# Patient Record
Sex: Male | Born: 1975 | Race: Black or African American | Hispanic: No | Marital: Married | Smoking: Never smoker
Health system: Southern US, Community
[De-identification: ages and names within clinical notes are randomized; demographics above are authoritative.]

## PROBLEM LIST (undated history)

## (undated) DIAGNOSIS — E785 Hyperlipidemia, unspecified: Secondary | ICD-10-CM

## (undated) DIAGNOSIS — E66811 Obesity, class 1: Secondary | ICD-10-CM

## (undated) DIAGNOSIS — E119 Type 2 diabetes mellitus without complications: Secondary | ICD-10-CM

## (undated) DIAGNOSIS — E669 Obesity, unspecified: Secondary | ICD-10-CM

## (undated) DIAGNOSIS — N183 Chronic kidney disease, stage 3 unspecified: Secondary | ICD-10-CM

## (undated) DIAGNOSIS — I119 Hypertensive heart disease without heart failure: Secondary | ICD-10-CM

## (undated) DIAGNOSIS — I10 Essential (primary) hypertension: Secondary | ICD-10-CM

## (undated) HISTORY — DX: Chronic kidney disease, stage 3 unspecified: N18.30

## (undated) HISTORY — DX: Hyperlipidemia, unspecified: E78.5

## (undated) HISTORY — DX: Essential (primary) hypertension: I10

## (undated) HISTORY — DX: Obesity, unspecified: E66.9

## (undated) HISTORY — DX: Type 2 diabetes mellitus without complications: E11.9

## (undated) HISTORY — DX: Obesity, class 1: E66.811

## (undated) HISTORY — DX: Hypertensive heart disease without heart failure: I11.9

## (undated) HISTORY — PX: NO PAST SURGERIES: SHX2092

## (undated) HISTORY — DX: Chronic kidney disease, stage 3 (moderate): N18.3

---

## 2006-10-29 ENCOUNTER — Encounter: Payer: Self-pay | Admitting: Cardiology

## 2006-10-29 ENCOUNTER — Ambulatory Visit: Payer: Self-pay | Admitting: Cardiology

## 2006-10-29 ENCOUNTER — Inpatient Hospital Stay (HOSPITAL_COMMUNITY): Admission: EM | Admit: 2006-10-29 | Discharge: 2006-10-31 | Payer: Self-pay | Admitting: Emergency Medicine

## 2006-11-03 ENCOUNTER — Emergency Department (HOSPITAL_COMMUNITY): Admission: EM | Admit: 2006-11-03 | Discharge: 2006-11-03 | Payer: Self-pay | Admitting: Family Medicine

## 2006-11-11 ENCOUNTER — Ambulatory Visit: Payer: Self-pay | Admitting: Cardiology

## 2006-11-15 ENCOUNTER — Ambulatory Visit: Payer: Self-pay | Admitting: Cardiology

## 2006-11-15 ENCOUNTER — Ambulatory Visit: Payer: Self-pay

## 2006-12-02 ENCOUNTER — Ambulatory Visit: Payer: Self-pay | Admitting: Cardiology

## 2006-12-17 ENCOUNTER — Ambulatory Visit: Payer: Self-pay | Admitting: Cardiology

## 2006-12-17 ENCOUNTER — Ambulatory Visit: Payer: Self-pay | Admitting: Internal Medicine

## 2007-01-24 ENCOUNTER — Ambulatory Visit: Payer: Self-pay | Admitting: Cardiology

## 2007-02-03 ENCOUNTER — Ambulatory Visit: Payer: Self-pay | Admitting: Internal Medicine

## 2007-02-27 ENCOUNTER — Ambulatory Visit: Payer: Self-pay | Admitting: Internal Medicine

## 2007-02-27 LAB — CONVERTED CEMR LAB
CO2: 29 meq/L (ref 19–32)
Chloride: 108 meq/L (ref 96–112)
GFR calc non Af Amer: 58 mL/min
HDL: 32.9 mg/dL — ABNORMAL LOW (ref >39.0)
VLDL: 19 mg/dL (ref 0–40)

## 2007-05-15 ENCOUNTER — Ambulatory Visit: Payer: Self-pay | Admitting: Internal Medicine

## 2007-05-15 LAB — CONVERTED CEMR LAB
CO2: 30 meq/L (ref 19–32)
Calcium: 9.6 mg/dL (ref 8.4–10.5)
Chloride: 106 meq/L (ref 96–112)
Creatinine, Ser: 1.7 mg/dL — ABNORMAL HIGH (ref 0.4–1.5)
GFR calc Af Amer: 60 mL/min
GFR calc non Af Amer: 50 mL/min
Potassium: 4.3 meq/L (ref 3.5–5.1)
Sodium: 141 meq/L (ref 135–145)

## 2007-06-13 IMAGING — CR DG CHEST 1V PORT
1 series · 1 of 1 positions shown · non-contrast
Comparison: none

HISTORY: Chest pain, hypertension, code STEMI

PORTABLE CHEST ONE VIEW:
Portable exam 9491 hours no priors for comparison.
Minimally kyphotic positioning.
Low lung volumes.
Slightly accentuated heart size.
Normal mediastinal contours and pulmonary vascularity.
Minimal basilar atelectasis bilaterally.
Lungs otherwise clear.

[view not recorded]
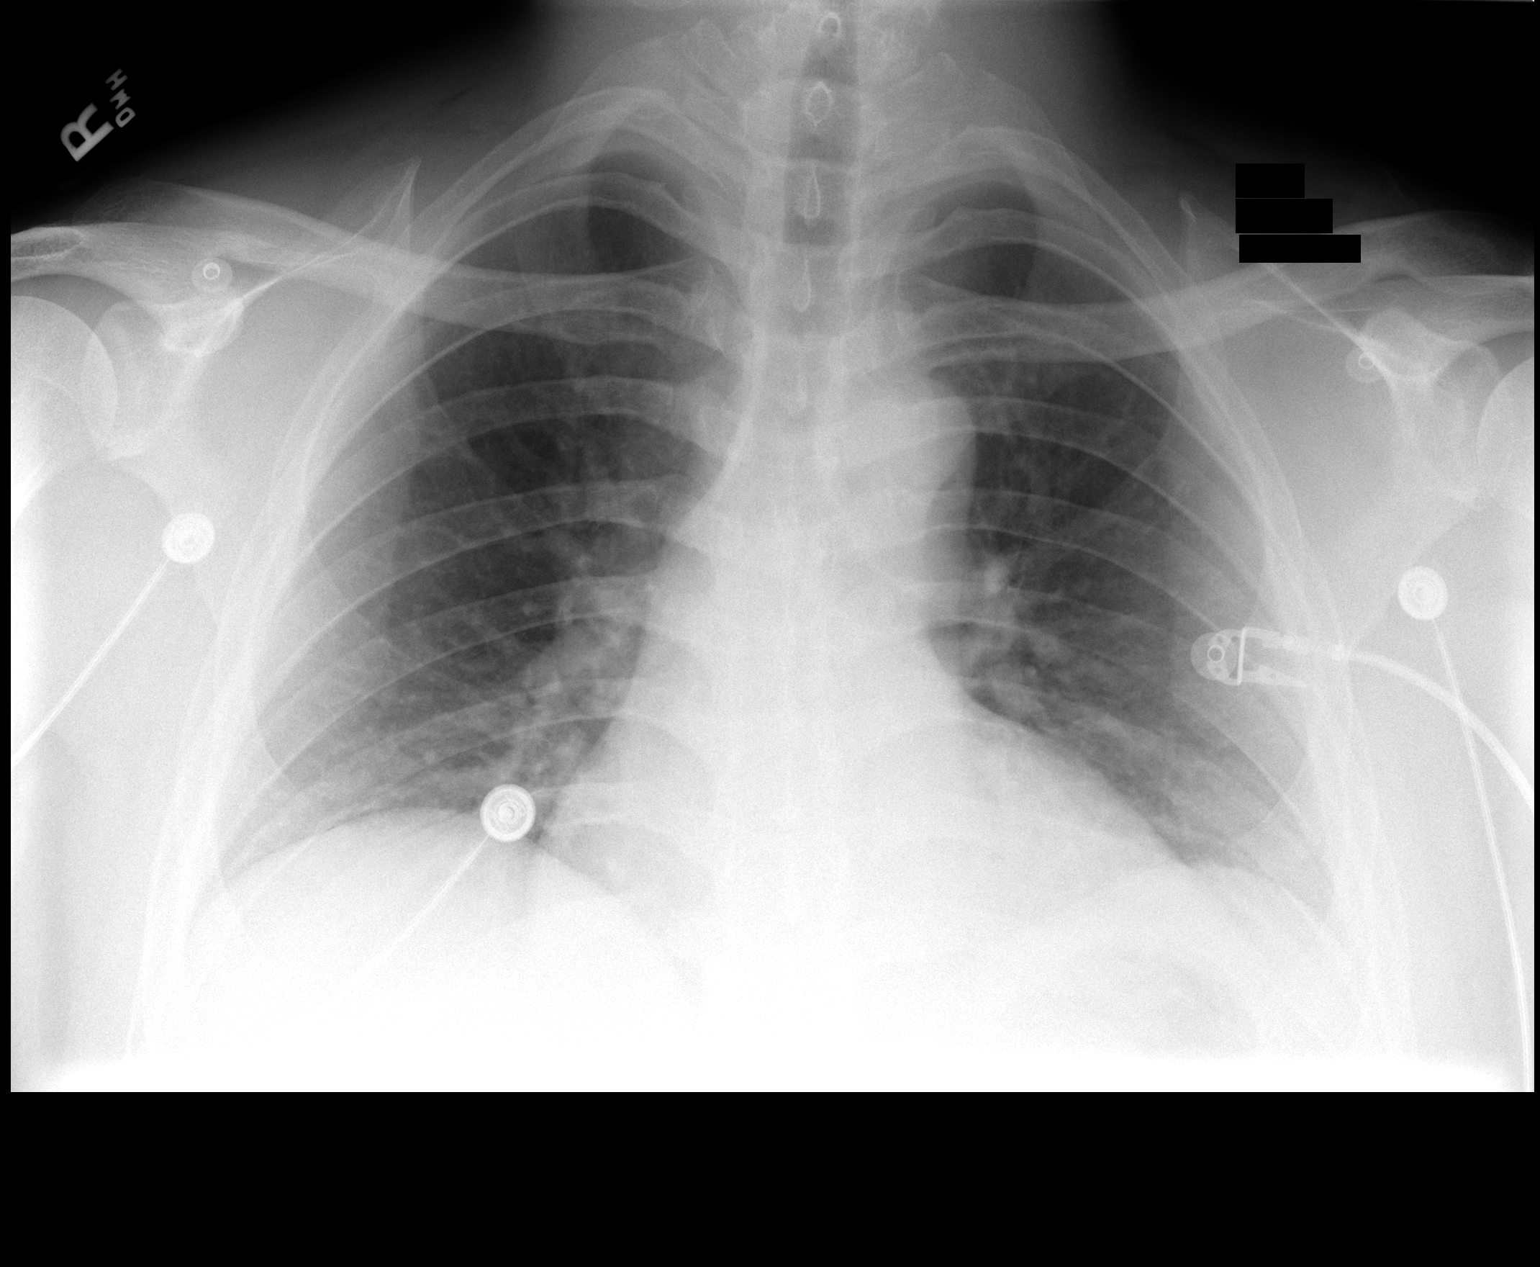

[1 of 1 positions shown; findings below may reference images not displayed]

IMPRESSION: Low lung volumes with minimal bibasilar atelectasis.

## 2007-07-25 ENCOUNTER — Ambulatory Visit: Payer: Self-pay | Admitting: Internal Medicine

## 2007-07-25 LAB — CONVERTED CEMR LAB: Creatinine, Ser: 1.6 mg/dL — ABNORMAL HIGH (ref 0.4–1.5)

## 2007-08-14 ENCOUNTER — Ambulatory Visit: Payer: Self-pay | Admitting: Internal Medicine

## 2007-10-29 DIAGNOSIS — E785 Hyperlipidemia, unspecified: Secondary | ICD-10-CM

## 2007-10-29 DIAGNOSIS — I10 Essential (primary) hypertension: Secondary | ICD-10-CM

## 2007-10-29 DIAGNOSIS — I119 Hypertensive heart disease without heart failure: Secondary | ICD-10-CM | POA: Insufficient documentation

## 2007-12-05 ENCOUNTER — Ambulatory Visit: Payer: Self-pay | Admitting: Internal Medicine

## 2007-12-24 ENCOUNTER — Telehealth: Payer: Self-pay | Admitting: Internal Medicine

## 2008-01-15 ENCOUNTER — Ambulatory Visit: Payer: Self-pay | Admitting: Internal Medicine

## 2008-01-15 DIAGNOSIS — J069 Acute upper respiratory infection, unspecified: Secondary | ICD-10-CM | POA: Insufficient documentation

## 2008-10-20 ENCOUNTER — Ambulatory Visit: Payer: Self-pay | Admitting: Internal Medicine

## 2008-10-20 LAB — CONVERTED CEMR LAB
CO2: 31 meq/L (ref 19–32)
Chloride: 109 meq/L (ref 96–112)
Cholesterol: 119 mg/dL (ref 0–200)
HDL: 31.4 mg/dL — ABNORMAL LOW (ref >39.0)
LDL Cholesterol: 74 mg/dL (ref 0–99)
Pro B Natriuretic peptide (BNP): 12 pg/mL (ref 0.0–100.0)
Sodium: 143 meq/L (ref 135–145)
Triglycerides: 67 mg/dL (ref 0–149)
VLDL: 13 mg/dL (ref 0–40)

## 2008-11-02 ENCOUNTER — Ambulatory Visit: Payer: Self-pay | Admitting: Cardiovascular Disease

## 2008-11-16 ENCOUNTER — Encounter: Payer: Self-pay | Admitting: Cardiovascular Disease

## 2008-11-16 ENCOUNTER — Ambulatory Visit: Payer: Self-pay

## 2009-03-14 ENCOUNTER — Telehealth: Payer: Self-pay | Admitting: Internal Medicine

## 2009-04-12 ENCOUNTER — Ambulatory Visit: Payer: Self-pay | Admitting: Internal Medicine

## 2009-10-17 ENCOUNTER — Ambulatory Visit: Payer: Self-pay | Admitting: Internal Medicine

## 2009-10-18 ENCOUNTER — Encounter: Payer: Self-pay | Admitting: Internal Medicine

## 2009-10-18 LAB — CONVERTED CEMR LAB
Albumin: 4.4 g/dL (ref 3.5–5.2)
CO2: 28 meq/L (ref 19–32)
Cholesterol: 137 mg/dL (ref 0–200)
Creatinine, Ser: 1.77 mg/dL — ABNORMAL HIGH (ref 0.40–1.50)
Glucose, Bld: 142 mg/dL — ABNORMAL HIGH (ref 70–99)
HDL: 33 mg/dL — ABNORMAL LOW (ref >39)
Indirect Bilirubin: 1 mg/dL — ABNORMAL HIGH (ref 0.0–0.9)
Sodium: 141 meq/L (ref 135–145)
Total CHOL/HDL Ratio: 4.2
Triglycerides: 268 mg/dL — ABNORMAL HIGH (ref <150)

## 2009-11-16 ENCOUNTER — Encounter: Payer: Self-pay | Admitting: Cardiovascular Disease

## 2009-11-16 DIAGNOSIS — E1122 Type 2 diabetes mellitus with diabetic chronic kidney disease: Secondary | ICD-10-CM | POA: Insufficient documentation

## 2009-11-16 DIAGNOSIS — N183 Chronic kidney disease, stage 3 (moderate): Secondary | ICD-10-CM

## 2010-03-14 ENCOUNTER — Ambulatory Visit: Payer: Self-pay | Admitting: Internal Medicine

## 2010-03-14 LAB — CONVERTED CEMR LAB
BUN: 17 mg/dL (ref 6–23)
CO2: 25 meq/L (ref 19–32)
Calcium: 9.9 mg/dL (ref 8.4–10.5)
Potassium: 3.8 meq/L (ref 3.5–5.3)
Sodium: 142 meq/L (ref 135–145)

## 2010-03-15 ENCOUNTER — Encounter: Payer: Self-pay | Admitting: Internal Medicine

## 2010-04-25 ENCOUNTER — Ambulatory Visit (HOSPITAL_COMMUNITY): Admission: RE | Admit: 2010-04-25 | Discharge: 2010-04-25 | Payer: Self-pay | Admitting: Internal Medicine

## 2010-04-25 ENCOUNTER — Encounter: Payer: Self-pay | Admitting: Internal Medicine

## 2010-04-25 ENCOUNTER — Ambulatory Visit: Payer: Self-pay | Admitting: Cardiology

## 2010-04-25 ENCOUNTER — Ambulatory Visit: Payer: Self-pay

## 2010-07-18 ENCOUNTER — Telehealth: Payer: Self-pay | Admitting: Internal Medicine

## 2010-07-19 ENCOUNTER — Encounter: Payer: Self-pay | Admitting: Internal Medicine

## 2010-07-19 LAB — CONVERTED CEMR LAB

## 2010-07-20 ENCOUNTER — Telehealth: Payer: Self-pay | Admitting: Internal Medicine

## 2010-09-21 ENCOUNTER — Encounter: Payer: Self-pay | Admitting: Internal Medicine

## 2010-09-21 LAB — CONVERTED CEMR LAB
ALT: 30 units/L (ref 0–53)
AST: 23 units/L (ref 0–37)
Albumin: 4.3 g/dL (ref 3.5–5.2)
Alkaline Phosphatase: 52 units/L (ref 39–117)
BUN: 20 mg/dL (ref 6–23)
Glucose, Bld: 119 mg/dL — ABNORMAL HIGH (ref 70–99)
Indirect Bilirubin: 1.1 mg/dL — ABNORMAL HIGH (ref 0.0–0.9)
Total Bilirubin: 1.4 mg/dL — ABNORMAL HIGH (ref 0.3–1.2)
Total CHOL/HDL Ratio: 4.1
VLDL: 24 mg/dL (ref 0–40)

## 2010-09-22 ENCOUNTER — Ambulatory Visit: Payer: Self-pay | Admitting: Internal Medicine

## 2010-09-22 LAB — CONVERTED CEMR LAB
ALT: 33 units/L (ref 0–53)
AST: 26 units/L (ref 0–37)
Albumin: 4.1 g/dL (ref 3.5–5.2)
Alkaline Phosphatase: 56 units/L (ref 39–117)
BUN: 19 mg/dL (ref 6–23)
Bilirubin, Direct: 0.2 mg/dL (ref 0.0–0.3)
CO2: 31 meq/L (ref 19–32)
Calcium: 10 mg/dL (ref 8.4–10.5)
Chloride: 107 meq/L (ref 96–112)
Cholesterol: 129 mg/dL (ref 0–200)
Creatinine, Ser: 1.6 mg/dL — ABNORMAL HIGH (ref 0.4–1.5)
GFR calc non Af Amer: 61.72 mL/min (ref >60)
Glucose, Bld: 69 mg/dL — ABNORMAL LOW (ref 70–99)
HDL: 29.3 mg/dL — ABNORMAL LOW (ref >39.00)
Hgb A1c MFr Bld: 6.1 % (ref 4.6–6.5)
LDL Cholesterol: 67 mg/dL (ref 0–99)
Potassium: 4.4 meq/L (ref 3.5–5.1)
Sodium: 144 meq/L (ref 135–145)
Total Bilirubin: 1.7 mg/dL — ABNORMAL HIGH (ref 0.3–1.2)
Total CHOL/HDL Ratio: 4
Total Protein: 7.3 g/dL (ref 6.0–8.3)
Triglycerides: 165 mg/dL — ABNORMAL HIGH (ref 0.0–149.0)
VLDL: 33 mg/dL (ref 0.0–40.0)

## 2010-09-28 ENCOUNTER — Ambulatory Visit: Payer: Self-pay | Admitting: Internal Medicine

## 2010-10-05 ENCOUNTER — Telehealth: Payer: Self-pay | Admitting: Internal Medicine

## 2010-10-31 ENCOUNTER — Telehealth: Payer: Self-pay | Admitting: Internal Medicine

## 2011-01-02 NOTE — Progress Notes (Signed)
Summary: STD Labs  Phone Note Call from Patient Call back at Home Phone [phone] Call back at [phone]   Caller: Patient Call For: [name] Summary of Call: patient called and left voice message requesting a return phone call regarding his  blood work. He wanted to know if he has ever been tested for STD"s. Call was returned to patient he was informed there were results on file. If he did not request the test, they are not performed  routinely. Patients states that he is getting married and his finance is requesting STD screening. He would like to have the blood test done this week if possible. Patient states that he does not have any symptoms and this is for pre marital purposes only. He is okay to have drawn whatever test Dr Artist Pais orders. Initial call taken by: Glendell Docker CMA,  July 18, 2010 12:10 PM  Follow-up for Phone Call        see orders Follow-up by: [name],  July 18, 2010 1:31 PM  Additional Follow-up for Phone Call Additional follow up Details #1::        patient aware of labs will have labs drawn in [facility] Additional Follow-up by: Glendell Docker CMA,  July 18, 2010 4:56 PM  New Problems: HEALTH SCREENING (ICD-V70.0)   New Problems: HEALTH SCREENING (ICD-V70.0)

## 2011-01-02 NOTE — Letter (Signed)
    at [facility] [address] [place], [place]  [address]  [place] Phone: [phone]      March 15, 2010   Rodney Ortiz [address] [place], [place] [address]  RE:  LAB RESULTS  Dear  [name],  The following is an interpretation of your most recent lab tests.  Please take note of any instructions provided or changes to medications that have resulted from your lab work.  ELECTROLYTES:  Good - no changes needed  KIDNEY FUNCTION TESTS:  Stable - no changes needed    Please remember to avoid over the counter NSAIDs (ibuprofen, motrin, aleve etc)     Sincerely Yours,    Dr. Thomos Lemons

## 2011-01-02 NOTE — Letter (Signed)
    at [facility] [address] [place], [place]  [address]  [place] Phone: [phone]      Apr 25, 2010   Rodney Ortiz [address] [place], [place] [address]  RE:  LAB RESULTS  Dear  Rodney Ortiz,  The following is an interpretation of your most recent lab tests.  Please take note of any instructions provided or changes to medications that have resulted from your lab work.    2D Echo - Ejection fraction 50-55%  - stable  Continue blood pressure management.  I will further discuss your test results at your next follow up appointment.     Sincerely Yours,    Dr. Thomos Lemons

## 2011-01-02 NOTE — Progress Notes (Signed)
Summary: Pain wiht hemrrohoids  Phone Note Call from Patient Call back at [facility] Phone [phone] Call back at Work Phone [phone]   Caller: Patient Call For: [name] Summary of Call: patient called and left voice message stating he is having some pain associated with hemrrhoids, which he believes is being cause by his medication Diovan and Carvedilol. This has been ongoing for the past 7 days. He would like to know if he should stop taking his medication or  if there is something that could be prescribed for his pain.  Initial call taken by: Glendell Docker CMA,  October 31, 2010 9:32 AM  Follow-up for Phone Call        do not stop BP meds see rx for hemorrhoid cream use sitz bath x 2-3 days if persistent rectal pain,  OV needed Follow-up by: [name],  October 31, 2010 5:08 PM  Additional Follow-up for Phone Call Additional follow up Details #1::        call returned to patient at [phone], no answer. A detailed voice message was left informing patient per Dr Artist Pais instructions. Message was left for patient to return call if any questions Additional Follow-up by: Glendell Docker CMA,  October 31, 2010 5:16 PM    New/Updated Medications: LIDOCAINE-HYDROCORTISONE ACE 3-0.5 % CREA (LIDOCAINE-HYDROCORTISONE ACE) apply two times a day x 1 week Prescriptions: LIDOCAINE-HYDROCORTISONE ACE 3-0.5 % CREA (LIDOCAINE-HYDROCORTISONE ACE) apply two times a day x 1 week  #1 week x 0   Entered and Authorized by:   [name]   Signed by:   [name] on 10/31/2010   Method used:   Electronically to        [facility] [phone]* (retail)       [address]       [place], [place]  [address]       Ph: [phone]       Fax: [phone]   RxID:   [id number]

## 2011-01-02 NOTE — Letter (Signed)
Summary: [facility]  [facility]   Imported By: Lanelle Bal 03/22/2010 08:43:31  _____________________________________________________________________  External Attachment:    Type:   Image     Comment:   External Document

## 2011-01-02 NOTE — Assessment & Plan Note (Signed)
Summary: 6 MONTH FOLLOW UP/MHF, resched from bump-jr   Vital Signs:  Patient profile:   35 year old male Height:      75 inches Weight:      238 pounds BMI:     29.86 O2 Sat:      99 % on Room air Temp:     98.0 degrees F oral Pulse rate:   84 / minute Pulse rhythm:   regular Resp:     18 per minute BP sitting:   100 / 70  (left arm) Cuff size:   large  Vitals Entered By: Glendell Docker CMA (March 14, 2010 3:34 PM)  O2 Flow:  Room air CC: Rm 2- 6 Month Follow up disease management Comments no concersn   Primary Care Provider:  Dondra Spry DO  CC:  Rm 2- 6 Month Follow up disease management.  History of Present Illness:  Hypertension Follow-Up      This is a 35 year old man who presents for Hypertension follow-up.  The patient denies lightheadedness, headaches, and edema.  The patient denies the following associated symptoms: chest pain.  Compliance with medications (by patient report) has been near 100%.  The patient reports that dietary compliance has been fair.  The patient reports exercising occasionally.    hyperlipidemia - stable.  good med and diet compliance  Allergies (verified): No Known Drug Allergies  Past History:  Past Medical History: Current Problems:  CARDIOMYOPATHY, HYPERTENSIVE (ICD-402.90) HYPERLIPIDEMIA (ICD-272.4)  HYPERTENSION (ICD-401.9) HYPERCHOLESTEROLEMIA (ICD-272.0) RENAL INSUFFICIENCY (ICD-588.9) RENAL DISEASE, CHRONIC, MILD (ICD-585.2) URI (ICD-465.9)  Family History: Mother deceased at age 3.  She was diabetic and had severe hypertension and died secondary to complications of myocardial infarction.  His father's medical history is unknown.  He has an older brother who is healthy.      Social History: Never Smoked  Alcohol use-no  Occupation:  Clinical cytogeneticist  Drug use-no  Physical Exam  General:  alert, well-developed, and well-nourished.   Neck:  supple and no masses.  no carotid bruits.   Lungs:  normal  respiratory effort and normal breath sounds.   Heart:  normal rate, regular rhythm, and no gallop.   Extremities:  No lower extremity edema  Neurologic:  cranial nerves II-XII intact and gait normal.     Impression & Recommendations:  Problem # 1:  HYPERTENSION (ICD-401.9) well controlled.    His updated medication list for this problem includes:    Diovan Hct 80-12.5 Mg Tabs (Valsartan-hydrochlorothiazide) ..... One by mouth once daily    Carvedilol 25 Mg Tabs (Carvedilol) ..... One by mouth two times a day  Orders: T-Basic Metabolic Panel [id number])  BP today: 100/70 Prior BP: 100/80 (10/17/2009)  Labs Reviewed: K+: 3.9 (10/17/2009) Creat: : 1.77 (10/17/2009)   Chol: 137 (10/17/2009)   HDL: 33 (10/17/2009)   LDL: 50 (10/17/2009)   TG: 268 (10/17/2009)  Problem # 2:  RENAL INSUFFICIENCY (ICD-588.9) Prev w/u negative by nephrologist.  presumed secondary to htn.  monitor BMET.  Problem # 3:  CARDIOMYOPATHY, HYPERTENSIVE (ICD-402.90) Pt asymptomatic.  Repeat 2 D Echo  His updated medication list for this problem includes:    Diovan Hct 80-12.5 Mg Tabs (Valsartan-hydrochlorothiazide) ..... One by mouth once daily    Carvedilol 25 Mg Tabs (Carvedilol) ..... One by mouth two times a day  Orders: Echo Referral (Echo)  Complete Medication List: 1)  Diovan Hct 80-12.5 Mg Tabs (Valsartan-hydrochlorothiazide) .... One by mouth once daily 2)  Carvedilol 25 Mg  Tabs (Carvedilol) .... One by mouth two times a day 3)  Simvastatin 40 Mg Tabs (Simvastatin) .... One by mouth at bedtime  Patient Instructions: 1)  Please schedule a follow-up appointment in 6 months. 2)  BMP prior to visit, ICD-9: 401.9 3)  Hepatic Panel prior to visit, ICD-9:  272.4 4)  Lipid Panel prior to visit, ICD-9: 272.4 5)  Please return for lab work one (1) week before your next appointment.  Prescriptions: SIMVASTATIN 40 MG  TABS (SIMVASTATIN) one by mouth at bedtime  #30 x 5   Entered and Authorized by:    [name]   Signed by:   [name] on 03/14/2010   Method used:   Electronically to        [facility] [phone]* (retail)       [address]       [place], [place]  [address]       Ph: [phone]       Fax: [phone]   RxID:   [id number] CARVEDILOL 25 MG TABS (CARVEDILOL) one by mouth two times a day  #30 x 5   Entered and Authorized by:   [name]   Signed by:   [name] on 03/14/2010   Method used:   Electronically to        [facility] [phone]* (retail)       [address]       [place], [place]  [address]       Ph: [phone]       Fax: [phone]   RxID:   [id number] DIOVAN HCT 80-12.5 MG TABS (VALSARTAN-HYDROCHLOROTHIAZIDE) one by mouth once daily  #30 x 5   Entered and Authorized by:   [name]   Signed by:   [name] on 03/14/2010   Method used:   Electronically to        [facility] [phone]* (retail)       [address]       [place], [place]  [address]       Ph: [phone]       Fax: [phone]   RxID:   [id number]   Current Allergies (reviewed today): No known allergies

## 2011-01-02 NOTE — Assessment & Plan Note (Signed)
Summary: 6 month fu/dt   Vital Signs:  Patient profile:   35 year old male Height:      75 inches Weight:      240 pounds BMI:     30.11 O2 Sat:      98 % on Room air Temp:     98.3 degrees F oral Pulse rate:   84 / minute Pulse rhythm:   regular Resp:     18 per minute BP sitting:   100 / 60  (right arm) Cuff size:   large  Vitals Entered By: Glendell Docker CMA (September 28, 2010 10:09 AM)  O2 Flow:  Room air CC: 6 month followup  Is Patient Diabetic? No Pain Assessment Patient in pain? no      Comments dicsuss medication changes, blood pressure has been low, discuss diet   Primary Care Provider:  Dondra Spry DO  CC:  6 month followup .  History of Present Illness:  Hypertension Follow-Up      This is a 35 year old man who presents for Hypertension follow-up.  The patient denies lightheadedness and headaches.  The patient denies the following associated symptoms: chest pain.  Compliance with medications (by patient report) has been near 100%.  The patient reports that dietary compliance has been fair.    cardiomyopathy - 2-D echocardiogram reviewed  Preventive Screening-Counseling & Management  Alcohol-Tobacco     Smoking Status: never  Allergies (verified): No Known Drug Allergies  Past History:  Past Medical History: Current Problems:  CARDIOMYOPATHY, HYPERTENSIVE (ICD-402.90) HYPERLIPIDEMIA (ICD-272.4)  HYPERTENSION (ICD-401.9) HYPERCHOLESTEROLEMIA (ICD-272.0) RENAL INSUFFICIENCY (ICD-588.9)  RENAL DISEASE, CHRONIC, MILD (ICD-585.2) URI (ICD-465.9)  Family History: Mother deceased at age 52.  She was diabetic and had severe hypertension and died secondary to complications of myocardial infarction.  His father's medical history is unknown.  He has an older brother who is healthy.       Physical Exam  General:  alert, well-developed, and well-nourished.   Neck:  supple and no masses.  no carotid bruits.   Lungs:  normal respiratory effort and  normal breath sounds.   Heart:  normal rate, regular rhythm, and no gallop.   Extremities:  No lower extremity edema    Impression & Recommendations:  Problem # 1:  CARDIOMYOPATHY, HYPERTENSIVE (ICD-402.90) Assessment Unchanged 2 D echo 04/2010  shows stable EF  Left ventricle: The cavity size was normal. Wall thickness was     increased in a pattern of mild LVH. There was mild focal basal     hypertrophy of the septum. Systolic function was normal. The     estimated ejection fraction was in the range of 50% to 55%. Wall     motion was normal; there were no regional wall motion abnormalities.     Features are consistent with a pseudonormal left ventricular filling     pattern, with concomitant abnormal relaxation and increased filling     pressure (grade 2 diastolic dysfunction).    His updated medication list for this problem includes:    Diovan 80 Mg Tabs (Valsartan) ..... One by mouth qd    Carvedilol 25 Mg Tabs (Carvedilol) ..... One by mouth bid  Problem # 2:  HYPERTENSION (ICD-401.9) Assessment: Unchanged BP is low normal.  DC hctz portion.   His updated medication list for this problem includes:    Diovan 80 Mg Tabs (Valsartan) ..... One by mouth qd    Carvedilol 25 Mg Tabs (Carvedilol) ..... One by mouth bid  BP today: 100/60 Prior BP: 100/70 (03/14/2010)  Labs Reviewed: K+: 4.4 (09/22/2010) Creat: : 1.6 (09/22/2010)   Chol: 129 (09/22/2010)   HDL: 29.30 (09/22/2010)   LDL: 67 (09/22/2010)   TG: 165.0 (09/22/2010)  Problem # 3:  HYPERLIPIDEMIA (ICD-272.4) Assessment: Unchanged  His updated medication list for this problem includes:    Simvastatin 40 Mg Tabs (Simvastatin) ..... One by mouth at bedtime  Labs Reviewed: SGOT: 26 (09/22/2010)   SGPT: 33 (09/22/2010)   HDL:29.30 (09/22/2010), 29 (09/21/2010)  LDL:67 (09/22/2010), 65 (09/21/2010)  Chol:129 (09/22/2010), 118 (09/21/2010)  Trig:165.0 (09/22/2010), 121 (09/21/2010)  Complete Medication List: 1)  Diovan  80 Mg Tabs (Valsartan) .... One by mouth qd 2)  Carvedilol 25 Mg Tabs (Carvedilol) .... One by mouth bid 3)  Simvastatin 40 Mg Tabs (Simvastatin) .... One by mouth at bedtime  Patient Instructions: 1)  Please schedule a follow-up appointment in 6 months. Prescriptions: CARVEDILOL 25 MG TABS (CARVEDILOL) one by mouth bid  #180 x 3   Entered and Authorized by:   [name]   Signed by:   [name] on 09/28/2010   Method used:   Electronically to        [facility]* (retail)             ,          Ph: [phone]       Fax: [phone]   RxID:   [id number] SIMVASTATIN 40 MG  TABS (SIMVASTATIN) one by mouth at bedtime  #90 x 3   Entered and Authorized by:   [name]   Signed by:   [name] on 09/28/2010   Method used:   Electronically to        [facility]* (retail)             ,          Ph: [phone]       Fax: [phone]   RxID:   [id number] DIOVAN 80 MG TABS (VALSARTAN) one by mouth qd  #90 x 3   Entered and Authorized by:   [name]   Signed by:   [name] on 09/28/2010   Method used:   Electronically to        [facility]* (retail)             ,          Ph: [phone]       Fax: [phone]   RxID:   [id number]    Orders Added: 1)  Est. Patient Level III [[address]]   Immunization History:  Influenza Immunization History:    Influenza:  historical (09/05/2010)   Immunization History:  Influenza Immunization History:    Influenza:  Historical (09/05/2010)  Current Allergies (reviewed today): No known allergies

## 2011-01-02 NOTE — Progress Notes (Signed)
Summary: Lab Results  Phone Note Outgoing Call   Summary of Call: call pt - HIV and RPR is negative.  mail copy to pt. Initial call taken by: [name],  July 20, 2010 12:07 PM  Follow-up for Phone Call        call placed to patient he has been advised. Copy of labs mailed to patient to address on file Follow-up by: Glendell Docker CMA,  July 21, 2010 12:18 PM

## 2011-01-02 NOTE — Progress Notes (Signed)
Summary: Diarrhea  Phone Note Call from Patient Call back at Home Phone [phone]   Caller: Patient Call For: [name] Summary of Call: patient called and stated that he had sudden onset of diarrhea 2 hours after last meal yesterday. He states he had diarrhea  that has been watery in consistency every hour up until 8 am this morning. He states that he has been able to tolerate oatmeal today, and has been drinking fluids with no problems. He denies fever and states that he is home today. He would like to know if this is something that he should be concerned with. Initial call taken by: Glendell Docker CMA,  October 05, 2010 11:30 AM  Follow-up for Phone Call        it is likely gastroenteritis.  hold diovan while he is having diarrhea.   increase fluids brat diet x 24 hrs.  (bananas, rice, apple sauce and dry toast) pt diarrhea does not get better within 24 -48 hrs and /or pt getting dehydrated,  I suggest OV Follow-up by: [name],  October 05, 2010 3:05 PM  Additional Follow-up for Phone Call Additional follow up Details #1::        call returned to patient at [phone], he has been advised per Dr Artist Pais instruction Additional Follow-up by: Glendell Docker CMA,  October 05, 2010 3:17 PM

## 2011-01-02 NOTE — Miscellaneous (Signed)
Summary: Orders Update  Clinical Lists Changes  Orders: Added new Test order of T-Basic Metabolic Panel ([id number]) - Signed Added new Test order of T-Hepatic Function ([id number]) - Signed Added new Test order of T-Lipid Profile ([id number]) - Signed  

## 2011-04-17 NOTE — Assessment & Plan Note (Signed)
[facility]                            CARDIOLOGY OFFICE NOTE   DEVERE, BREM                        MRN:          [id number]  DATE:11/02/2008                            DOB:          28-Aug-1976    Ariv is new patient to me.  He is previously been seen by Dr. Diona Browner.  He has a history of mild chronic renal insufficiency, hypertension,  hypercholesterolemia, and nonischemic cardiomyopathy.  The patient was  last seen in 2007.  He tends to be fairly sedentary.   When initially evaluated he had an ejection fraction of 40%.  Followup  echocardiogram done November 2007, showed an EF of 50-55%.   Since then, he has been on good blood pressure control medicines  including an ACE inhibitor and a beta-blocker.   He sees Dr. Annie Sable and Dr. Artist Pais and his most recent  creatinine was 1.5 which was checked I believe in April.   He is fairly sedentary.  However, he clearly has not had any significant  palpitations, presyncope, PND, orthopnea.  There has been no lower  extremity edema.   The patient has been compliant with his medications.   There is no family history of cardiomyopathy.   He has no known allergies.  His past medical history is otherwise  remarkable for mild chronic renal insufficiency, hyperlipidemia, and  hypertension.   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  He  is from [place], both his parents are dead.  He is engaged to be  married to his girlfriend, of 4 years.  She does not have any children.  He does not drink or smoke.  He does not have a lot of outside hobbies.   CURRENT MEDICATIONS:  1. Coreg 25 b.i.d.  2. Benicar 20/12.5.  3. Simvastatin 40 a day.   FAMILY HISTORY:  Noncontributory.   PHYSICAL EXAMINATION:  GENERAL:  Remarkable for an overweight black male  in no distress.  He has some striae on his shoulders.  VITAL SIGNS:  His blood pressure is 110/71, weight 228,  pulse 80 and  regular, respiratory rate 14, afebrile.  HEENT:  Unremarkable.  Carotids normal without bruit.  No  lymphadenopathy, thyromegaly, or JVP elevation.  LUNGS:  Clear.  Good diaphragmatic motion.  No wheezing.  HEART:  S1 and S2, normal heart sounds.  PMI normal.  ABDOMEN:  Benign.  Bowel sounds positive.  No AAA, no tenderness, no  bruit, no hepatosplenomegaly, no hepatojugular reflux tenderness.  EXTREMITIES:  Distal pulse intact.  No edema.  NEURO:  Nonfocal.  Skin warm and dry.  No muscular weakness.   His electrocardiogram is normal.  His lab work from Dr. Artist Pais is reviewed.  His creatinine was 1.5, LDL cholesterol 76.   IMPRESSION:  1. History of cardiomyopathy.  Check 2-D echocardiogram.  Reassess LV      function since it has been 2 years.  Continue beta-blocker, ACE      inhibitor.  2. Hypertension.  Currently well controlled.  Continue low-sodium diet      and  current medications.  3. Hypercholesterolemia.  LDL within target range for young person      with no coronary disease.  Check LFTs in 6 months.  4. History of mild chronic renal insufficiency.  Continue ACE      inhibitor.  Follow up with Dr. Kathrene Bongo given the patient's      size and body mass, creatinine of 1.5, to me it would be more upper      limits of normal.  He has had a renal duplex in 2007, which showed      no renal artery stenosis.   Blood pressure seems well-controlled at this point.     Noralyn Pick. Eden Emms, MD, [facility]  Electronically Signed    PCN/MedQ  DD: 11/02/2008  DT: 11/02/2008  Job #: [id number]

## 2011-04-20 NOTE — Assessment & Plan Note (Signed)
[facility]                           PRIMARY CARE OFFICE NOTE   Rodney Ortiz, Rodney Ortiz                        MRN:          [id number]  DATE:12/17/2006                            DOB:          10/20/1976    REFERRING PHYSICIAN:  Jonelle Sidle, MD   CHIEF COMPLAINT:  New patient to the practice.   HISTORY OF PRESENT ILLNESS:  The patient is a 35 year old  African/American male, here to establish primary care.  He has not had a  primary care physician in the past but was recently hospitalized in  November 2007, with hypertensive urgency.  He apparently ruled out for a  myocardial infarction and had a negative stress test.  He underwent a 2-  D echocardiogram which revealed a low-normal ejection fraction of 50%  and moderate thickness of the left ventricular wall.   His creatinine at the time of hospitalization was also elevated at 1.9,  which ultimately trended down to 1.6 on the most recent testing.  He is  currently on a regimen of Diovan 160 mg once a day.  He is in the  process of upper-titration of Coreg to 25 mg b.i.d. He does not recall  any hospital or doctor visits in the past, with a noted elevated blood  pressure readings.  He was asymptomatic until the most recent  hypertensive episode.  He denies any history of headaches.  No visual  changes.   He has been tolerating his current anti-hypertensive regimen well  without any adverse effect.  A preliminary workup for secondary  hypertension was initiated with negative urine studies for  metanephrines.  In addition he apparently had an ultrasound/Doppler of  his kidneys.  It is noted that his kidneys were normal in size.  The  left kidney is slightly longer at 1.4 cm.  There was no noted of any  renal artery stenosis.  There is no history to support hyper-cortisol  state.  He does not have any history of abnormal blood sugars in the  past, and does not have moon facies or easy  bruising.   On other review of systems, he denies any over-the-counter medication  use.  He denies using any over-the-counter NSAIDs on a chronic basis.  He has used Aleve in the past on an episodic basis, usually with the  frequency of once per month.   PAST MEDICAL HISTORY:  1. Hypertensive urgency.  2. Hypertensive cardiomyopathy.  3. History of elevated cholesterol.   CURRENT MEDICATIONS:  1. Diovan 160 mg once daily.  2. Coreg 25 mg b.i.d.   ALLERGIES:  No known drug allergies.   SOCIAL HISTORY:  The patient is single.  He lives alone.  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   FAMILY HISTORY:  Mother deceased at age 66.  She was diabetic and had  severe hypertension and died secondary to complications of myocardial  infarction.  His father's medical history is unknown.  He has an older  brother who is healthy.   HABITS:  He does  not drink.  He does not smoke.  No history of  recreational drug use.   PHYSICAL EXAMINATION:  VITAL SIGNS:  Height 6 feet 2 inches, weight 232  pounds, temperature 98.9 degrees, pulse 76, blood pressure 148/90 on the  left side in the seated position with a manual cuff.  GENERAL:  The patient is a pleasant, well-developed and well-nourished,  35 year old African/American male, in no apparent distress.  HEENT:  Normocephalic and atraumatic.  Pupils equal and reactive to  light bilaterally.  Extraocular motility intact.  Sclerae anicteric.  Conjunctivae within normal limits.  Auditory canals and tympanic  membranes clear bilaterally.  Oropharyngeal exam was unremarkable.  NECK:  Supple, no adenopathy, carotid bruit or thyromegaly.  CHEST:  Normal respiratory effort.  Chest was clear to auscultation  bilaterally.  No rhonchi, rales or wheezing.  CARDIOVASCULAR:  A regular rate and rhythm.  No significant murmurs,  rubs or gallops appreciated.  ABDOMEN:  Soft, nontender, with positive bowel sounds.  No  organomegaly.  I do not appreciate any abdominal bruits.  MUSCULOSKELETAL:  No clubbing, cyanosis or edema.  The patient had  intact dorsalis pedis pulses, equal and symmetric.  SKIN:  Warm and dry.  NEUROLOGIC:  Cranial nerves II-XII  grossly intact.  He was nonfocal.   LABORATORY DATA:  Most recent BMET shows a creatinine of 1.7, BUN 12.  A  review of hospital records notes that he did have a urinalysis during  the hospitalization with bland urinary sediment.  There was trace  protein.   IMPRESSION:  1. Hypertension, uncontrolled.  2. Family history of hypertension and type 2 diabetes.  3. History of hyperlipidemia.  4. Health maintenance.   RECOMMENDATIONS:  I doubt that the patient has secondary hypertension;  however, we will request a consultation by a nephrologist, to rule out  other possible causes of secondary hypertension, such as  hyperaldosteronism and also hyper-cortisol state.  I doubt that the  patient has coarctation.   We discussed the long-term goals of maintaining his kidney function,  avoiding further injury and also a goal of maintaining a normal blood  pressure of less than 130/80.  He was strongly  encouraged to discontinue and not use any NSAIDs from this point on, and  also to avoid any dehydrating illnesses.   FOLLOWUP:  Is in approximately four to six weeks.     Barbette Hair. Artist Pais, DO  Electronically Signed    RDY/MedQ  DD: 12/17/2006  DT: 12/18/2006  Job #: [id number]

## 2011-04-20 NOTE — Assessment & Plan Note (Signed)
[facility]                            CARDIOLOGY OFFICE NOTE   BINYAMIN, NELIS                        MRN:          [id number]  DATE:01/24/2007                            DOB:          1976-08-03    PRIMARY CARE PHYSICIAN:  Barbette Hair. Artist Pais, D.O.   REASON FOR VISIT:  Routine followup.   HISTORY OF PRESENT ILLNESS:  I saw Mr. Kingry back in January.  His  history is detailed in my previous note.  He has established with Dr.  Artist Pais in the meanwhile and also reportedly has pending followup with  nephrology given his history of hypertension and elevated creatinine.  Symptomatically, he states that he is doing quite well.  He continues to  exercise regularly and he has lost approximately 28 pounds since  November by his account.  He is not experiencing any angina or dyspnea  on exertion.  He reports compliance with his medications which are  outlined below and has a followup in the near future with Dr. Artist Pais for  further assessment of his hypertension.  I focused more on his cardiac  evaluation to-date and have spoken with him about his mildly reduced  left ventricular systolic function based on prior Myoview and  echocardiography.  His regimen is certainly reasonable in this regard  and more than likely he will need a repeat echocardiogram over the next  6 months for reassessment.   ALLERGIES:  No known drug allergies.   PRESENT MEDICATIONS:  1. Coreg 25 mg p.o. b.i.d.  2. Diovan 160 mg p.o. daily.   REVIEW OF SYSTEMS:  As described in the history of present illness.   EXAMINATION:  Blood pressure 140/94, heart rate is 68, weight is 219  pounds.  The patient is comfortable, well-nourished, in no acute  distress.  Examination of the neck shows no elevated jugular venous  pressure.  Cardiac exam reveals a regular rate and rhythm without loud  murmur or S3 gallop.  LUNGS:  Clear to auscultation anteriorly.  EXTREMITIES:  Show no obvious edema.   IMPRESSION AND RECOMMENDATIONS:  1. Hypertension, likely essential.  I think formal nephrology      assessment is certainly reasonable, as has been arranged by Dr.      Artist Pais, particularly given the patient's mild renal dysfunction.  I      have already initiated a basic evaluation for secondary causes of      hypertension and this has been reassuring.  At this point, Mr.      Arquette will continue to follow up with Dr. Artist Pais for primary control      of his hypertension and we will plan to see him back from the      perspective of his cardiomyopathy.  He is asymptomatic and doing      quite well but I would anticipate a followup 2-D echocardiogram      over the next 6 months with a symptom      review at that time, assuming there are no interval developments.  2. Further plans to follow.  Jonelle Sidle, MD  Electronically Signed    SGM/MedQ  DD: 01/24/2007  DT: 01/24/2007  Job #: [id number]   cc:   Barbette Hair. Artist Pais, DO

## 2011-04-20 NOTE — Assessment & Plan Note (Signed)
[facility]                            CARDIOLOGY OFFICE NOTE   LAMOYNE, HESSEL                          MRN:          [id number]  DATE:12/17/2006                            DOB:          1976-07-29    REASON FOR VISIT:  Follow up hypertension and cardiomyopathy.   HISTORY OF PRESENT ILLNESS:  I saw Mr. Rodney Ortiz back in the office in mid  December.  His history is well detailed in my previous note.  He  continues to report feeling fine.  He has been exercising regularly in  the gym and has lost down from 237 pounds to 228 pounds.  He has been  watching his diet very carefully and eating a fair degree of fish and  chicken.  We did discuss sodium restriction as well.  His medications  have been doubled since his last visit, given continued hypertension.  His doses are listed below.  Followup BMET in the interim showed a  potassium of 3.9, BUN of 12, and a creatinine of 1.7 which was down from  1.9.  Today I discussed with him the rationale for the present  medication choices, specifically as they relate to both hypertension and  his apparent cardiomyopathy which looks to be nonischemic based on  recent testing.  I have him scheduled to see Dr. Artist Pais to establish  regular care as he has not had a primary care Chalese Peach.  He brought in a  home blood pressure log which shows diastolics still increasing into the  100-110 range with systolics typically no higher than the 160s.  I  reviewed the possibility of adding a diuretic ultimately to his present  regimen, although we elected to further up-titrate his medications prior  to doing this.   ALLERGIES:  No known drug allergies.   PRESENT MEDICATIONS:  1. Carvedilol 12.5 mg p.o. b.i.d.  2. Diovan 80 mg p.o. b.i.d.   REVIEW OF SYSTEMS:  As described in the history of present illness.   EXAMINATION:  VITAL SIGNS:  Blood pressure today 154/102, heart rate 69,  weight 228 pounds.  GENERAL:  The patient is  comfortable and in no acute distress.  LUNGS:  Clear anteriorly.  CARDIAC:  Reveals a regular rate and rhythm without loud murmur or  gallop.  EXTREMITIES:  Exhibit no significant pitting edema.   IMPRESSION AND RECOMMENDATIONS:  1. Hypertension, likely essential hypertension based on prior      assessment detailed in my previous note.  This is not well      controlled as yet and he is undergoing medication adjustments.  I      have recommended that he increase his carvedilol to 25 mg p.o.      b.i.d. and continue Diovan at 160 mg daily.  Prescriptions were      provided for both.  I anticipate that if this is not effective we      may need to add a diuretic in combination with his angiotensin      receptor blocker.  He has a concurrent cardiomyopathy with ejection  fraction of 50% by echocardiography but listed as 40% by followup      Myoview.  This appears to be nonischemic and perhaps related to      hypertension.  He is not manifesting any active heart failure      symptoms.  He will plan to follow up with Dr. Artist Pais this afternoon to      establish as a new patient, and I will plan to see him back over      the next month for further medication adjustment.  2. Further plans to follow.     Jonelle Sidle, MD  Electronically Signed    SGM/MedQ  DD: 12/17/2006  DT: 12/17/2006  Job #: [id number]

## 2011-04-20 NOTE — Discharge Summary (Signed)
NAMECLIMMIE, Rodney Ortiz                 ACCOUNT NO.:  [id number]   MEDICAL RECORD NO.:  [id number]          PATIENT TYPE:  INP   LOCATION:  6531                         FACILITY:  [facility]   PHYSICIAN:  Jonelle Sidle, MD DATE OF BIRTH:  [birth year]   DATE OF ADMISSION:  10/29/2006  DATE OF DISCHARGE:  10/30/2006                               DISCHARGE SUMMARY   PRIMARY CARE PHYSICIAN:  Brett Canales A. Cleta Alberts, M.D.   PRIMARY CARDIOLOGIST:  Jonelle Sidle, M.D.   PRINCIPAL DIAGNOSIS:  Hypertensive urgency.   SECONDARY DIAGNOSIS:  Hyperlipidemia.   ALLERGIES:  No known drug allergies.   PROCEDURES:  2-D echocardiogram.   HISTORY OF PRESENT ILLNESS:  A 35 year old African male with prior  history of probable untreated hypertension who, over the past several  days, has been experiencing what he describes as feeling a more  prominent heart beat, perhaps a bit faster.  He denied any frank chest  discomfort or breathlessness.  He had more symptoms November 27,  prompting him to present to Urgent Care where he saw Dr. Lesle Chris.  An EKG showed fairly deep lateral T-wave inversions with 0.5 to 1 mm ST-  segment depression laterally associated with ST-segment elevation up to  1 mm in leads I and aVR with incomplete right bundle branch block  pattern.  He was subsequently transferred to [facility] for further  evaluation.   On arrival in the ED, he was given IV labetalol and also started on IV  nitroglycerin, and the patient noted that he had some relief of  symptoms.  A 2-D echocardiogram was performed at bedside revealing an EF  of approximately 50% with no obvious large focal wall motion  abnormalities or significant pericardial effusion.  Chest x-ray showed  atelectasis and poor lung volumes.  His cardiac markers were normal, and  his systolic blood pressure was 180-200 with diastolics in the 120-130  range.  He was admitted for further evaluation.   HOSPITAL COURSE:  It was noted on  admission he initially had an elevated  creatinine of 1.9.  He was maintained on labetalol therapy which was  advanced along with wean of nitroglycerin, subsequently with improvement  in blood pressure down into the 130-140/60s to 80s.  A urinalysis as  well as a 24-hour urine for metanephrine, catecholamines, and BMA was  sent off, and, at this point, results are pending.  The patient has not  had any recurrent symptoms and will be discharged home this afternoon in  satisfactory condition.  We have scheduled several followup  appointments, all to take place prior to December 15 including a renal  artery duplex scan, adenosine Myoview, and followup with Dr. Diona Browner at  3:00 p.m. on December 14.  He was asked to let us know if he developed  problems in the interim.   DISCHARGE LABORATORY DATA:  Hemoglobin 15.0, hematocrit 43.7, WBC 6.4,  platelets 190, MCV 81.1.  Sodium 140, potassium 3.6, chloride 108, CO2  25, BUN 12, creatinine down to 1.6, glucose 117.  PT 13.3, INR 1.0, PTT  28. Total bilirubin 1.1,  alkaline phosphatase 83, AST 25, ALT 22,  albumin 3.6. CK 130, MB 1.3, troponin-I 0.05. Total cholesterol 218,  triglycerides 124, HDL 32, LDL 161. Calcium 9.0.  TSH 1.123. D-dimer  less than 0.22.   DISPOSITION:  The patient is being discharged home today in satisfactory  condition.   FOLLOWUP PLANS AND APPOINTMENTS:  He has followup renal artery duplex at  [facility] of December 14 at 8:30 a.m.  He also has an adenosine  Myoview scheduled for that day at 9:30 a.m..  Finally, he will follow up  Dr. Diona Browner at 3:00 p.m. on that day.   DISCHARGE MEDICATIONS:  1. Aspirin 81 mg daily.  2. Labetalol 200 mg 2 tablets b.i.d.   OUTSTANDING LABORATORY STUDIES:  UA and urine collection for  metanephrines.   DURATION DISCHARGE ENCOUNTER:  40 minutes including physician time.      Rodney Ortiz, ANP      Jonelle Sidle, MD  Electronically Signed    CB/MEDQ  D:   10/31/2006  T:  10/31/2006  Job:  [id number]   cc:   Brett Canales A. Cleta Alberts, M.D.

## 2011-04-20 NOTE — H&P (Signed)
Rodney Ortiz, Rodney Ortiz                 ACCOUNT NO.:  [id number]   MEDICAL RECORD NO.:  [id number]          PATIENT TYPE:  INP   LOCATION:  1823                         FACILITY:  [facility]   PHYSICIAN:  Rodney Sidle, MD DATE OF BIRTH:  Apr 07, 1976   DATE OF ADMISSION:  10/29/2006  DATE OF DISCHARGE:                                HISTORY & PHYSICAL   REFERRING PHYSICIAN:  Brett Canales A. Cleta Ortiz, M.D.   REASON FOR ADMISSION:  Hypertensive urgency and abnormal electrocardiogram.   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  He does  have some family history of premature cardiovascular disease.  He also has a  potential history of hypertension, stating that while he was in the [employer]  approximately 5-6 years ago, he was told that blood pressure was somewhat  elevated, although it does not seem that he was treated with medications.  He has been in his usual state of health until the last few days.  He states  that intermittently he has experienced a general awareness of his heart  beating, stating that it feels more prominent, perhaps a bit faster.  He  denies having any frank chest discomfort with this or breathlessness.  He  felt that these symptoms were worse today prompting evaluation at Urgent  Care by Rodney Ortiz.  An electrocardiogram was obtained which was abnormal, in  summary showing fairly deep lateral T wave inversions with 0.5 to 1 mm ST  segment depression laterally, also associated with ST segment elevation up  to 1 mm and predominantly noted in leads V1 and aVR with an incomplete right  bundle branch block pattern.  No old tracings were available for comparison.   We were contacted by Rodney Ortiz about the patient's pending transfer to [facility] Emergency Department.  En route, he apparently received beta  blocker therapy intravenously, and in retrospect now states that his chest  felt somewhat relieved/less tight after receiving this, although was not  really aware of any discomfort to begin with.  On my examination in the  emergency department, he stated that he was feeling essentially at baseline  without any feeling of chest heaviness or tightness.  His electrocardiogram  was repeated, and I reviewed these strips with Rodney Ortiz.  The patient was  placed on intravenous nitroglycerin and also treated with intravenous  labetalol.  A bedside echocardiogram was obtained in the absence of any  ongoing chest pain which revealed a left ventricular ejection fraction of  approximately 50% with no obvious large focal wall motion abnormalities or  significant pericardial effusion.  His chest x-ray showed atelectasis with  low lung volumes.  D-dimer level was obtained and was within normal limits  at less than 0.22.  Initial cardiac markers were also reassuring with a CK-  MB of 1.6 and a troponin I level less than 0.05.  Blood pressure was  initially ranging from 180-200 systolic over 120-130 diastolic and,  with the  above therapies, began to trend down with systolics in the 170-180s over  diastolic of 100-110.  Of note, the patient's renal function was noted to be  abnormal with a BUN of 17 and creatinine 1.9 without any baseline for  comparison.   ALLERGIES:  No reported drug allergies.   PAST MEDICAL HISTORY:  As outlined above.  He denies having any prior  surgeries or prolonged hospital stays.   SOCIAL HISTORY:  The patient is single.  He has no local family members,  stating that his mother died last year.  He denies any tobacco or alcohol  use.  He works as an Rodney Ortiz.   FAMILY HISTORY:  Significant for death of the patient's mother at age 37  with a myocardial infarction.   REVIEW OF SYSTEMS:  As described in History of Present Illness.  He has had  no obvious  cough, fevers, or chills.  Has no orthopnea, palpitations, or  syncope.  Denies any problems with lower extremity edema.  States he had  been in his usual state of health until recently and typically has no major  functional limitations.   PHYSICAL EXAMINATION:  VITAL SIGNS: Heart rate 70s to 80s in sinus rhythm.  Respiratory rate 16.  Systolic blood pressure improving from the 190s down  to the 170s with diastolic improving from the 120s down to the 100s.  GENERAL:  The patient is a well-nourished, well-developed male in no acute  distress, denying any active chest discomfort.  HEENT:  Conjunctivae and lids grossly normal.  Oropharynx clear.  NECK:  Supple without elevated jugular venous pressure, without bruits.  No  thyromegaly is noted.  LUNGS:  Clear without labored breathing.  No wheezing is noted.  CARDIAC:  Exam reveals a regular rate and rhythm without loud systolic  murmur, pericardial rub, or S3 gallop.  ABDOMEN:  Soft, nontender, with normoactive bowel sounds.  No obvious  hepatomegaly.  EXTREMITIES: Exhibit no significant pitting edema.  Distal pulses are 2+.  SKIN:  No ulcerative changes are noted.  MUSCULOSKELETAL: No kyphosis is noted.  NEUROPSYCHIATRIC: The patient is alert and oriented x3.  Affect is normal.   LABORATORY DATA:  WBC 6.4, hemoglobin 15.0, hematocrit 43.7, platelets 190.  INR 1.0.  D-dimer less than 0.22.  Sodium 138, potassium 3.8, chloride 107,  bicarb 25, glucose 86, BUN 17, creatinine 1.9, AST 25, ALT 22.  CK-MB 1.6,  troponin I less than 0.05, myoglobin 107.   IMPRESSION:  1. Hypertensive urgency with abnormal electrocardiogram.  There is      evidence of at least moderate left ventricular hypertrophy by bedside      echocardiogram, and it is possible that some of the      electrocardiographic changes could be due to repolarization      abnormalities.  Major symptoms have been of a general awareness of     heart beat and perhaps some amount of  chest tightness, although this      has been fairly vague.  Initial cardiac markers are reassuring as is D-      dimer level.  Chest x-ray does not show widened mediastinum, and there      is essentially atelectasis with low lung volumes.  One suspects the      possibility of longer standing hypertension.  The possibility of      underlying ischemic heart disease is still to be considered.  There is      a  family history of cardiovascular disease with the patient's mother      having had a myocardial infarction in her early 65s.  At this point,      lipid status is not certain.  2. Renal insufficiency with a BUN and creatinine of 17 and 1.9,      respectively.  Baseline is not certain.   PLAN:  I discussed the situation in detail with the patient and answered his  questions.  Rodney Ortiz and I also reviewed his electrocardiograms.  He will  be admitted to the step-down unit, and we will continue to cycle cardiac  markers as well as follow up on electrocardiograms.  We will plan to  continue intravenous nitroglycerin, titrating this for blood pressure  control as well as begin him on oral labetalol starting at 200 mg p.o.  b.i.d.; p.r.n. intravenous hydralazine is also ordered.  Aim for blood  pressure control of systolics less than 160 and diastolics less than 100 to  start.  We will not initiate heparin at this time given his elevated blood  pressure.  He can begin a coated baby aspirin as his blood pressure comes  under better control.  We will also plan to check a TSH, lipid profile, and  urinalysis, particularly given his renal insufficiency.  This may need  further investigation going forward.  I would anticipate  that he will need follow-up ischemic testing, although which type (invasive  versus noninvasive) is not entirely clear at this point.  Our initial goal  will be blood pressure control, and Rodney Ortiz will plan to see the patient  in the morning for further  evaluation.      Rodney Sidle, MD  Electronically Signed     SGM/MEDQ  D:  10/29/2006  T:  10/29/2006  Job:  [id number]   cc:   Rodney Ortiz, M.D.

## 2011-04-20 NOTE — Assessment & Plan Note (Signed)
[facility]                            CARDIOLOGY OFFICE NOTE   CHRISTORPHER, HISAW                          MRN:          [id number]  DATE:11/15/2006                            DOB:          08-17-1976    PRIMARY CARE PHYSICIAN:  None currently.   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  He ruled out for a myocardial  infarction during that stay and was noted to have an abnormal creatinine  initially of 1.9, although ultimately trending down to 1.6.  His blood  pressure was controlled initially with intravenous Labetalol and  nitroglycerin with subsequent transition to oral Labetalol.  He was not  initiated on ACE-inhibitor or angiotensin-receptor blocker therapy given  his elevated creatinine prior to investigating the possibility of renal  artery stenosis.  His urinalysis did not showed some protein but no  blood.  He did have a urine collection for metanephrine's which was  normal and also ruled out for a myocardial infarction with serial  troponin-I levels.  He had a 2-D echocardiogram revealing an ejection  fraction of approximately 50% and was discharged home to continue  further outpatient evaluation.   In the interim, he presented to an urgent care with complaints of a  nosebleed and was noted to have elevated blood pressure as well,  possibly in the 170/115 range.  At that point, he was placed on Dyazide,  although following a phone call to our office, we asked him to  discontinue the medication until we were able to reassess his potassium  and renal function.  In the interim, he was scheduled for a followup  BMET with findings of a potassium of 4.7 and a BUN and creatinine of 17  and 1.9, respectively (off Dyazide).  He had a renal artery duplex, the  preliminary report of which indicates normal renal  arteries with overall  normal kidney size and normal caliber abdominal aorta.  An adenosine  Myoview was also performed which was interpreted by Dr. Myrtis Ser to show an  ejection fraction of 40% with description of decreased thickening in the  septum and an inferior wall as well as increased chamber size, although  with no clear evidence of ischemia and possible attentuation artifact  affecting the anterior wall.  There were no ischemic  electrocardiographic changes.   I discussed the results with the patient in detail today.  Symptomatically, he is not reporting any exertional chest pain and has  been doing some walking since I last saw him.  He has a home blood  pressure monitor and has been following his blood pressures.  His  systolics have been ranging from the 130s to 160s with diastolics in the  80s up to the 110 range.  I correlated this with a manual blood pressure  cuff today and noted that his monitor measured from 5 to 10 mmHg higher  than with manual measurements.  We discussed his present regimen and the  possibility of adjustments that may be more beneficial long-term.  We  talked about the possibility of a hypertensive cardiomyopathy and also  the possibility that his elevated creatinine may be due to longer  standing hypertension.  We discussed diet modifications and exercise and  I have recommended that he establish with a primary care Arvel Oquinn for  long-term management and better coordination of referral for additional  evaluation if needed.  I offered to arrange this followup for him and he  agreed.   ALLERGIES:  No known drug allergies.   PRESENT MEDICATIONS:  Labetalol 400 mg p.o. b.i.d.   REVIEW OF SYSTEMS:  As described in the history of present illness.   EXAMINATION:  Blood pressure today 148/86.  Heart rate 85.  Weight 237  pounds.  The patient is comfortable and in no acute distress.  HEENT:  Conjunctivae, lids are grossly normal.  NECK:  No elevated  jugular venous pressure.  CARDIAC:  Regular rate and rhythm without S3 gallop.  Blood pressure in  the right arm was correlated with a manual cuff.  EXTREMITIES:  No significant pitting edema.   IMPRESSION AND RECOMMENDATIONS:  1. Hypertension, likely essential hypertension and long-standing based      on the available information.  No hematuria by urinalysis to      suggest nephritis, no clear evidence of pheochromocytoma or renal      artery stenosis based on urine collection for metanephrine's and a      recent renal artery duplex.  He has both baseline renal      insufficiency as well as left ventricular hypertrophy and mild left      ventricular dysfunction, raising the possibility of a hypertensive      cardiomyopathy.  I have recommended a change from Labetalol to      Carvedilol with the addition of Diovan as combination therapy which      can be further titrated for better blood pressure control.  My hope      is that this regimen will be better suited for both ventricular      remodeling and also renal protection going forward.  I will have      him begin this regimen with a repeat BMET in 2 weeks' time.  We      will also plan to see him back in 1 month.  I will arrange for him      to see Dr. Artist Pais to establish as a new patient for ongoing evaluation      and regular followup.  He may need Nephrology evaluation      ultimately.  2. Further plans to follow.     Jonelle Sidle, MD  Electronically Signed    SGM/MedQ  DD: 11/15/2006  DT: 11/15/2006  Job #: [id number]

## 2011-09-04 ENCOUNTER — Other Ambulatory Visit: Payer: Self-pay | Admitting: Internal Medicine

## 2011-09-07 ENCOUNTER — Encounter: Payer: Self-pay | Admitting: Internal Medicine

## 2011-09-14 NOTE — Telephone Encounter (Signed)
Pt has cpx sch for 10-12-2011 230pm. Pt would like to have diovan 80 mg #90, simvastatin 40mg  #90 and generic coreg 25mg  #90 with 3 refills. Please fax to [facility] [phone]

## 2011-09-21 ENCOUNTER — Encounter: Payer: Self-pay | Admitting: Internal Medicine

## 2011-09-24 MED ORDER — SIMVASTATIN 40 MG PO TABS: 40.0000 mg | ORAL_TABLET | Freq: Every evening | ORAL | Status: DC

## 2011-09-24 MED ORDER — VALSARTAN 80 MG PO TABS: 80.0000 mg | ORAL_TABLET | Freq: Every day | ORAL | Status: DC

## 2011-09-24 MED ORDER — CARVEDILOL PHOSPHATE ER 20 MG PO CP24: 20.0000 mg | ORAL_CAPSULE | Freq: Every day | ORAL | Status: DC

## 2011-09-24 NOTE — Telephone Encounter (Signed)
Pt called again regarding refill to [facility]. Pt requesting refill on Diovan,Simcastatin and Coreg

## 2011-09-24 NOTE — Telephone Encounter (Signed)
rx sent in electronically 

## 2011-09-25 ENCOUNTER — Other Ambulatory Visit: Payer: Self-pay | Admitting: *Deleted

## 2011-09-25 MED ORDER — VALSARTAN 80 MG PO TABS: 80.0000 mg | ORAL_TABLET | Freq: Every day | ORAL | Status: DC

## 2011-09-25 MED ORDER — SIMVASTATIN 40 MG PO TABS: 40.0000 mg | ORAL_TABLET | Freq: Every evening | ORAL | Status: DC

## 2011-09-25 MED ORDER — CARVEDILOL 25 MG PO TABS: 25.0000 mg | ORAL_TABLET | Freq: Two times a day (BID) | ORAL | Status: DC

## 2011-10-10 ENCOUNTER — Encounter: Payer: Self-pay | Admitting: Internal Medicine

## 2011-10-12 ENCOUNTER — Ambulatory Visit (INDEPENDENT_AMBULATORY_CARE_PROVIDER_SITE_OTHER): Payer: BC Managed Care – PPO | Admitting: Internal Medicine

## 2011-10-12 ENCOUNTER — Encounter: Payer: Self-pay | Admitting: Internal Medicine

## 2011-10-12 DIAGNOSIS — R7309 Other abnormal glucose: Secondary | ICD-10-CM

## 2011-10-12 DIAGNOSIS — Z0000 Encounter for general adult medical examination without abnormal findings: Secondary | ICD-10-CM

## 2011-10-12 DIAGNOSIS — Z0001 Encounter for general adult medical examination with abnormal findings: Secondary | ICD-10-CM | POA: Insufficient documentation

## 2011-10-12 DIAGNOSIS — N259 Disorder resulting from impaired renal tubular function, unspecified: Secondary | ICD-10-CM

## 2011-10-12 DIAGNOSIS — E785 Hyperlipidemia, unspecified: Secondary | ICD-10-CM

## 2011-10-12 DIAGNOSIS — I10 Essential (primary) hypertension: Secondary | ICD-10-CM

## 2011-10-12 LAB — HEPATIC FUNCTION PANEL
Albumin: 3.8 g/dL (ref 3.5–5.2)
Alkaline Phosphatase: 67 U/L (ref 39–117)
Bilirubin, Direct: 0.1 mg/dL (ref 0.0–0.3)
Total Bilirubin: 1.1 mg/dL (ref 0.3–1.2)
Total Protein: 7.2 g/dL (ref 6.0–8.3)

## 2011-10-12 LAB — CBC
HCT: 42.8 % (ref 39.0–52.0)
Hemoglobin: 14 g/dL (ref 13.0–17.0)
MCH: 28.1 pg (ref 26.0–34.0)
MCV: 85.8 fL (ref 78.0–100.0)
RBC: 4.99 MIL/uL (ref 4.22–5.81)
RDW: 12.8 % (ref 11.5–15.5)
WBC: 5.2 10*3/uL (ref 4.0–10.5)

## 2011-10-12 LAB — BASIC METABOLIC PANEL
Calcium: 9.2 mg/dL (ref 8.4–10.5)
GFR: 54.15 mL/min — ABNORMAL LOW (ref >60.00)
Sodium: 144 mEq/L (ref 135–145)

## 2011-10-12 LAB — LIPID PANEL: Cholesterol: 126 mg/dL (ref 0–200)

## 2011-10-12 MED ORDER — CARVEDILOL 25 MG PO TABS: 25.0000 mg | ORAL_TABLET | Freq: Two times a day (BID) | ORAL | Status: DC

## 2011-10-12 MED ORDER — SIMVASTATIN 40 MG PO TABS: 40.0000 mg | ORAL_TABLET | Freq: Every evening | ORAL | Status: DC

## 2011-10-12 MED ORDER — VALSARTAN 80 MG PO TABS: 80.0000 mg | ORAL_TABLET | Freq: Every day | ORAL | Status: DC

## 2011-10-12 NOTE — Assessment & Plan Note (Signed)
Reviewed adult health maintenance protocols. The patient reports being up-to-date with influenza vaccine at work.  I encouraged healthy diet and regular exercise especially considering history of abnormal glucose.

## 2011-10-12 NOTE — Progress Notes (Signed)
  Subjective:    Patient ID: Rodney Ortiz, male    DOB: [birth year], 35 y.o.   MRN: [id number]  HPI  35 year old African American male with history of hypertension, hyperlipidemia, renal insufficiency for routine physical. He denies any significant interval medical history. 2 months ago he got married.  He is planning honeymoon to [place].  Patient has been very compliant with his blood pressure and cholesterol medications. He denies any dizziness or chest pain.  Last year we discovered elevated A1c. Patient has not been following a low carb diet  Review of Systems     Constitutional: Negative for activity change, appetite change and unexpected weight change.  Eyes: Negative for visual disturbance.  Respiratory: Negative for cough, chest tightness and shortness of breath.   Cardiovascular: Negative for chest pain.  Genitourinary: Negative for difficulty urinating.  Neurological: Negative for headaches.  Gastrointestinal: Negative for abdominal pain, heartburn melena or hematochezia Psych: Negative for depression or anxiety Endo:  No polyuria or polydypsia    Past Medical History  Diagnosis Date  . 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 Hyperlipidemia   . Pure hypercholesterolemia   . Unspecified disorder resulting from impaired renal function   . Chronic kidney disease, stage II (mild)   . Hypertension     History   Social History  . Marital Status: Married    Spouse Name: N/A    Number of Children: N/A  . Years of Education: N/A   Occupational History  . Not on file.   Social History Main Topics  . Smoking status: Never Smoker   . Smokeless tobacco: Not on file  . Alcohol Use: No  . Drug Use: No  . Sexually Active:    Other Topics Concern  . Not on file   Social History Narrative  . No narrative on file    No past surgical history on file.  Family History  Problem Relation Age of Onset  . Hypertension Mother   . Diabetes  Mother   . Heart attack Mother     No Known Allergies  No current outpatient prescriptions on file prior to visit.    BP 124/82  Pulse 92  Temp(Src) 98.4 F (36.9 C) (Oral)  Ht 6' 0.5" (1.842 m)  Wt 245 lb (111.131 kg)  BMI 32.77 kg/m2     Objective:   Physical Exam   Constitutional: Appears well-developed and well-nourished. No distress.  Head: Normocephalic and atraumatic.  Ear:  Right and left ear normal.  TMs clear.  Hearing is grossly normal Mouth/Throat: Oropharynx is clear and moist.  Eyes: Conjunctivae are normal. Pupils are equal, round, and reactive to light.  Neck: Normal range of motion. Neck supple. No thyromegaly present. No carotid bruit Cardiovascular: Normal rate, regular rhythm and normal heart sounds.  Exam reveals no gallop and no friction rub.  No murmur heard. Pulmonary/Chest: Effort normal and breath sounds normal.  No wheezes. No rales.  Abdominal: Soft. Bowel sounds are normal. No mass. There is no tenderness.  Neurological: Alert. No cranial nerve deficit.  Skin: Skin is warm and dry.  Psychiatric: Normal mood and affect. Behavior is normal.      Assessment & Plan:

## 2011-10-12 NOTE — Assessment & Plan Note (Signed)
Monitor renal function. Patient understands to avoid NSAIDs and dehydration.

## 2011-10-12 NOTE — Assessment & Plan Note (Signed)
Good medication compliance. Monitor her lipid panel and LFTs.

## 2011-10-17 ENCOUNTER — Encounter: Payer: Self-pay | Admitting: Internal Medicine

## 2012-10-09 ENCOUNTER — Other Ambulatory Visit: Payer: Self-pay | Admitting: Internal Medicine

## 2012-10-27 ENCOUNTER — Ambulatory Visit: Payer: BC Managed Care – PPO | Admitting: Internal Medicine

## 2012-10-28 ENCOUNTER — Ambulatory Visit (INDEPENDENT_AMBULATORY_CARE_PROVIDER_SITE_OTHER): Payer: BC Managed Care – PPO | Admitting: Internal Medicine

## 2012-10-28 ENCOUNTER — Encounter: Payer: Self-pay | Admitting: Internal Medicine

## 2012-10-28 VITALS — BP 130/90 | HR 76 | Temp 99.3°F | Wt 242.0 lb

## 2012-10-28 DIAGNOSIS — E785 Hyperlipidemia, unspecified: Secondary | ICD-10-CM

## 2012-10-28 DIAGNOSIS — I10 Essential (primary) hypertension: Secondary | ICD-10-CM

## 2012-10-28 MED ORDER — SIMVASTATIN 40 MG PO TABS: 40.0000 mg | ORAL_TABLET | Freq: Every evening | ORAL | Status: DC

## 2012-10-28 MED ORDER — CARVEDILOL 25 MG PO TABS: 25.0000 mg | ORAL_TABLET | Freq: Two times a day (BID) | ORAL | Status: DC

## 2012-10-28 MED ORDER — VALSARTAN 80 MG PO TABS: 80.0000 mg | ORAL_TABLET | Freq: Every day | ORAL | Status: DC

## 2012-10-28 NOTE — Progress Notes (Signed)
  Subjective:    Patient ID: Rodney Ortiz, male    DOB: [birth year], 36 y.o.   MRN: [id number]  HPI  36 year old African American male with history of hypertension, hypertensive cardiomyopathy and renal insufficiency for routine follow up.  Overall patient has been doing well. No significant interval medical history. 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  Patient reports good medication compliance with antihypertensives. He does not monitor his blood pressure at home.   Hyperlipidemia - Stable  Review of Systems Negative for chest pain or shortness of breath  Past Medical History  Diagnosis Date  . 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 Hyperlipidemia   . Pure hypercholesterolemia   . Unspecified disorder resulting from impaired renal function   . Chronic kidney disease, stage II (mild)   . Hypertension     History   Social History  . Marital Status: Married    Spouse Name: N/A    Number of Children: N/A  . Years of Education: N/A   Occupational History  . Not on file.   Social History Main Topics  . Smoking status: Never Smoker   . Smokeless tobacco: Not on file  . Alcohol Use: No  . Drug Use: No  . Sexually Active:    Other Topics Concern  . Not on file   Social History Narrative  . No narrative on file    No past surgical history on file.  Family History  Problem Relation Age of Onset  . Hypertension Mother   . Diabetes Mother   . Heart attack Mother     No Known Allergies  Current Outpatient Prescriptions on File Prior to Visit  Medication Sig Dispense Refill  . [DISCONTINUED] carvedilol (COREG) 25 MG tablet Take 1 tablet (25 mg total) by mouth 2 (two) times daily.  180 tablet  3  . [DISCONTINUED] simvastatin (ZOCOR) 40 MG tablet Take 1 tablet (40 mg total) by mouth every evening.  90 tablet  3  . [DISCONTINUED] valsartan (DIOVAN) 80 MG tablet Take 1 tablet (80 mg total) by mouth daily.  90 tablet  3     BP 130/90  Pulse 76  Temp 99.3 F (37.4 C) (Oral)  Wt 242 lb (109.77 kg)       Objective:   Physical Exam  Constitutional: He is oriented to person, place, and time. He appears well-developed and well-nourished.  Cardiovascular: Normal rate, regular rhythm and normal heart sounds.   Pulmonary/Chest: Effort normal and breath sounds normal. He has no wheezes.  Musculoskeletal: He exhibits no edema.  Neurological: He is alert and oriented to person, place, and time. No cranial nerve deficit.  Skin: Skin is warm and dry.  Psychiatric: He has a normal mood and affect. His behavior is normal.          Assessment & Plan:

## 2012-10-28 NOTE — Assessment & Plan Note (Signed)
Monitor FLP and LFTs. 

## 2012-10-28 NOTE — Assessment & Plan Note (Signed)
Stable.  Continue same medication regimen.  BP: 130/90 mmHg  Monitor electrolytes and kidney function.

## 2012-11-04 ENCOUNTER — Other Ambulatory Visit: Payer: Self-pay | Admitting: Internal Medicine

## 2012-11-04 LAB — HEMOGLOBIN A1C
Hgb A1c MFr Bld: 6.1 % — ABNORMAL HIGH (ref <5.7)
Mean Plasma Glucose: 128 mg/dL — ABNORMAL HIGH (ref <117)

## 2012-11-05 LAB — LIPID PANEL
Cholesterol: 139 mg/dL (ref 0–200)
HDL: 34 mg/dL — ABNORMAL LOW (ref >39)
Total CHOL/HDL Ratio: 4.1 Ratio
Triglycerides: 111 mg/dL (ref <150)
VLDL: 22 mg/dL (ref 0–40)

## 2012-11-05 LAB — BASIC METABOLIC PANEL: Calcium: 9.7 mg/dL (ref 8.4–10.5)

## 2012-11-06 ENCOUNTER — Encounter: Payer: Self-pay | Admitting: Internal Medicine

## 2013-09-30 ENCOUNTER — Other Ambulatory Visit: Payer: Self-pay | Admitting: Internal Medicine

## 2013-11-21 ENCOUNTER — Other Ambulatory Visit: Payer: Self-pay | Admitting: Internal Medicine

## 2013-12-02 ENCOUNTER — Encounter: Payer: Self-pay | Admitting: Internal Medicine

## 2013-12-02 ENCOUNTER — Ambulatory Visit (INDEPENDENT_AMBULATORY_CARE_PROVIDER_SITE_OTHER): Payer: BC Managed Care – PPO | Admitting: Internal Medicine

## 2013-12-02 VITALS — BP 126/98 | HR 80 | Temp 99.1°F | Ht 72.5 in | Wt 256.0 lb

## 2013-12-02 DIAGNOSIS — N182 Chronic kidney disease, stage 2 (mild): Secondary | ICD-10-CM

## 2013-12-02 DIAGNOSIS — N259 Disorder resulting from impaired renal tubular function, unspecified: Secondary | ICD-10-CM

## 2013-12-02 DIAGNOSIS — E785 Hyperlipidemia, unspecified: Secondary | ICD-10-CM

## 2013-12-02 DIAGNOSIS — I10 Essential (primary) hypertension: Secondary | ICD-10-CM

## 2013-12-02 DIAGNOSIS — R7309 Other abnormal glucose: Secondary | ICD-10-CM

## 2013-12-02 DIAGNOSIS — E1121 Type 2 diabetes mellitus with diabetic nephropathy: Secondary | ICD-10-CM | POA: Insufficient documentation

## 2013-12-02 LAB — LIPID PANEL
LDL Cholesterol: 64 mg/dL (ref 0–99)
Total CHOL/HDL Ratio: 4

## 2013-12-02 LAB — BASIC METABOLIC PANEL
Chloride: 107 mEq/L (ref 96–112)
Sodium: 139 mEq/L (ref 135–145)

## 2013-12-02 LAB — CBC WITH DIFFERENTIAL/PLATELET
Basophils Relative: 0.5 % (ref 0.0–3.0)
HCT: 41.9 % (ref 39.0–52.0)
Hemoglobin: 14.1 g/dL (ref 13.0–17.0)
Lymphocytes Relative: 31.9 % (ref 12.0–46.0)
Monocytes Relative: 7.8 % (ref 3.0–12.0)
Neutro Abs: 2.5 10*3/uL (ref 1.4–7.7)
Neutrophils Relative %: 53.9 % (ref 43.0–77.0)
Platelets: 182 10*3/uL (ref 150.0–400.0)
RBC: 5.15 Mil/uL (ref 4.22–5.81)
RDW: 13 % (ref 11.5–14.6)
WBC: 4.7 10*3/uL (ref 4.5–10.5)

## 2013-12-02 LAB — HEPATIC FUNCTION PANEL
ALT: 41 U/L (ref 0–53)
AST: 28 U/L (ref 0–37)
Alkaline Phosphatase: 67 U/L (ref 39–117)
Bilirubin, Direct: 0.1 mg/dL (ref 0.0–0.3)
Total Bilirubin: 1 mg/dL (ref 0.3–1.2)

## 2013-12-02 LAB — TSH: TSH: 0.77 u[IU]/mL (ref 0.35–5.50)

## 2013-12-02 MED ORDER — CARVEDILOL 25 MG PO TABS: 25.0000 mg | ORAL_TABLET | Freq: Two times a day (BID) | ORAL | Status: DC

## 2013-12-02 MED ORDER — SIMVASTATIN 40 MG PO TABS: 40.0000 mg | ORAL_TABLET | Freq: Every day | ORAL | Status: DC

## 2013-12-02 MED ORDER — VALSARTAN 80 MG PO TABS: 80.0000 mg | ORAL_TABLET | Freq: Every day | ORAL | Status: DC

## 2013-12-02 NOTE — Progress Notes (Signed)
Pre visit review using our clinic review tool, if applicable. No additional management support is needed unless otherwise documented below in the visit note. 

## 2013-12-02 NOTE — Patient Instructions (Signed)
Goal weight loss 5-10 lbs before next office visit Limit your carbohydrate intake to 30 grams per day Avoid dehydration and NSAIDs

## 2013-12-02 NOTE — Assessment & Plan Note (Signed)
Stable.  Monitor Electrolytes and kidney function.

## 2013-12-02 NOTE — Assessment & Plan Note (Signed)
Continue simvastatin.  Monitor FLP and LFTs.

## 2013-12-02 NOTE — Assessment & Plan Note (Signed)
Blood pressure is well-controlled. No change in medication regimen. I urged weight loss. BP: 126/98 mmHg

## 2013-12-02 NOTE — Assessment & Plan Note (Signed)
Patient previously counseled regarding abnormal blood sugar is slightly elevated A1c. Patient has gained weight since previous visit. Continue to monitor for progression to type 2 diabetes. I encouraged low carb diet and regular exercise.

## 2013-12-02 NOTE — Progress Notes (Signed)
   Subjective:    Patient ID: Rodney Ortiz, male    DOB: 1976-09-16, 37 y.o.   MRN: [id number]  HPI  37 year old African American male with history of hypertensive cardiomyopathy, renal insufficiency and hyperlipidemia for routine followup.  Prediabetes-patient has gained approximately 14 pounds since previous visit. He reports following low-carb diet however has not been watching his portion control. He does not exercise on a regular basis.  Review of Systems Negative for chest pain, negative for dyspnea and exertion    Past Medical History  Diagnosis Date  . 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 Hyperlipidemia   . Pure hypercholesterolemia   . Unspecified disorder resulting from impaired renal function   . Chronic kidney disease, stage II (mild)   . Hypertension     History   Social History  . Marital Status: Married    Spouse Name: N/A    Number of Children: N/A  . Years of Education: N/A   Occupational History  . Not on file.   Social History Main Topics  . Smoking status: Never Smoker   . Smokeless tobacco: Not on file  . Alcohol Use: No  . Drug Use: No  . Sexual Activity:    Other Topics Concern  . Not on file   Social History Narrative  . No narrative on file    No past surgical history on file.  Family History  Problem Relation Age of Onset  . Hypertension Mother   . Diabetes Mother   . Heart attack Mother     No Known Allergies  No current outpatient prescriptions on file prior to visit.   No current facility-administered medications on file prior to visit.    BP 126/98  Pulse 80  Temp(Src) 99.1 F (37.3 C) (Oral)  Ht 6' 0.5" (1.842 m)  Wt 256 lb (116.121 kg)  BMI 34.22 kg/m2    Objective:   Physical Exam  Constitutional: He is oriented to person, place, and time. He appears well-developed and well-nourished.  HENT:  Head: Normocephalic and atraumatic.  Eyes: EOM are normal. Pupils are equal, round, and  reactive to light.  Neck: Neck supple.  No carotid bruit  Cardiovascular: Normal rate, regular rhythm, normal heart sounds and intact distal pulses.   No murmur heard. Pulmonary/Chest: Effort normal and breath sounds normal. He has no wheezes.  Musculoskeletal: He exhibits no edema.  Neurological: He is alert and oriented to person, place, and time. No cranial nerve deficit.  Skin: Skin is warm and dry. No erythema.  Psychiatric: He has a normal mood and affect. His behavior is normal.          Assessment & Plan:

## 2013-12-09 ENCOUNTER — Other Ambulatory Visit: Payer: Self-pay | Admitting: *Deleted

## 2013-12-09 DIAGNOSIS — E119 Type 2 diabetes mellitus without complications: Secondary | ICD-10-CM

## 2013-12-09 MED ORDER — METFORMIN HCL 500 MG PO TABS: 500.0000 mg | ORAL_TABLET | Freq: Two times a day (BID) | ORAL | Status: DC

## 2014-03-03 ENCOUNTER — Ambulatory Visit (INDEPENDENT_AMBULATORY_CARE_PROVIDER_SITE_OTHER): Payer: BC Managed Care – PPO | Admitting: Internal Medicine

## 2014-03-03 ENCOUNTER — Encounter: Payer: Self-pay | Admitting: Internal Medicine

## 2014-03-03 VITALS — BP 142/90 | HR 62 | Temp 98.4°F | Ht 75.0 in | Wt 243.0 lb

## 2014-03-03 DIAGNOSIS — E119 Type 2 diabetes mellitus without complications: Secondary | ICD-10-CM

## 2014-03-03 DIAGNOSIS — I10 Essential (primary) hypertension: Secondary | ICD-10-CM

## 2014-03-03 DIAGNOSIS — N259 Disorder resulting from impaired renal tubular function, unspecified: Secondary | ICD-10-CM

## 2014-03-03 MED ORDER — METFORMIN HCL 500 MG PO TABS: 500.0000 mg | ORAL_TABLET | Freq: Three times a day (TID) | ORAL | Status: DC

## 2014-03-03 NOTE — Assessment & Plan Note (Signed)
Patient's A1c increased to 7.0. He is new onset type II diabetic. He is tolerating metformin 500 mg twice daily. I suggest titrating to 500 mg 3 times a day with meals. Continue low-carb diet and regular exercise. Lab Results  Component Value Date   HGBA1C 7.0* 12/02/2013

## 2014-03-03 NOTE — Progress Notes (Signed)
   Subjective:    Patient ID: Rodney Ortiz, male    DOB: 1976-04-07, 38 y.o.   MRN: [id number]  HPI  38 year old African American male with hypertension, hyperlipidemia and new onset type 2 diabetes for followup. Patient tolerating metformin 500 mg twice daily. He reports mild GI side effects. Patient has made significant changes to his diet and is exercising on weekends. He has lost weight since previous visit.  Hypertension-stable  CRI -  Lab Results  Component Value Date   CREATININE 1.4 12/02/2013     Review of Systems Negative for chest pain, no visual changes    Past Medical History  Diagnosis Date  . 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 Hyperlipidemia   . Pure hypercholesterolemia   . Unspecified disorder resulting from impaired renal function   . Chronic kidney disease, stage II (mild)   . Hypertension     History   Social History  . Marital Status: Married    Spouse Name: N/A    Number of Children: N/A  . Years of Education: N/A   Occupational History  . Not on file.   Social History Main Topics  . Smoking status: Never Smoker   . Smokeless tobacco: Not on file  . Alcohol Use: No  . Drug Use: No  . Sexual Activity:    Other Topics Concern  . Not on file   Social History Narrative  . No narrative on file    No past surgical history on file.  Family History  Problem Relation Age of Onset  . Hypertension Mother   . Diabetes Mother   . Heart attack Mother     No Known Allergies  Current Outpatient Prescriptions on File Prior to Visit  Medication Sig Dispense Refill  . carvedilol (COREG) 25 MG tablet Take 1 tablet (25 mg total) by mouth 2 (two) times daily with a meal.  180 tablet  1  . simvastatin (ZOCOR) 40 MG tablet Take 1 tablet (40 mg total) by mouth at bedtime.  90 tablet  1  . valsartan (DIOVAN) 80 MG tablet Take 1 tablet (80 mg total) by mouth daily.  90 tablet  1   No current facility-administered  medications on file prior to visit.    BP 142/90  Pulse 62  Temp(Src) 98.4 F (36.9 C) (Oral)  Ht 6\' 3"  (1.905 m)  Wt 243 lb (110.224 kg)  BMI 30.37 kg/m2  SpO2 96%    Objective:   Physical Exam  Constitutional: He is oriented to person, place, and time. He appears well-developed and well-nourished. No distress.  HENT:  Head: Normocephalic and atraumatic.  Neck: Neck supple.  Cardiovascular: Normal rate, regular rhythm and normal heart sounds.   No murmur heard. Pulmonary/Chest: Effort normal and breath sounds normal. He has no wheezes.  Musculoskeletal: He exhibits no edema.  Neurological: He is alert and oriented to person, place, and time.  Skin: Skin is warm and dry.  Psychiatric: He has a normal mood and affect. His behavior is normal.          Assessment & Plan:

## 2014-03-03 NOTE — Assessment & Plan Note (Signed)
BP sporadically elevated.  No change in medication regimen. BP: 142/90 mmHg  Lab Results  Component Value Date   CREATININE 1.4 12/02/2013

## 2014-03-03 NOTE — Assessment & Plan Note (Signed)
Continue to monitor closely.  If significant decline in renal function, we will have to reduce or discontinue metformin.

## 2014-03-03 NOTE — Progress Notes (Signed)
Pre visit review using our clinic review tool, if applicable. No additional management support is needed unless otherwise documented below in the visit note. 

## 2014-03-04 LAB — BASIC METABOLIC PANEL
BUN: 20 mg/dL (ref 6–23)
CALCIUM: 9.5 mg/dL (ref 8.4–10.5)
CHLORIDE: 106 meq/L (ref 96–112)
CO2: 24 meq/L (ref 19–32)
CREATININE: 1.6 mg/dL — AB (ref 0.4–1.5)
GFR: 49.73 mL/min — ABNORMAL LOW (ref >60.00)
GLUCOSE: 73 mg/dL (ref 70–99)
Potassium: 4.1 mEq/L (ref 3.5–5.1)
Sodium: 140 mEq/L (ref 135–145)

## 2014-03-04 LAB — HEMOGLOBIN A1C: Hgb A1c MFr Bld: 6.1 % (ref 4.6–6.5)

## 2014-03-22 ENCOUNTER — Telehealth: Payer: Self-pay

## 2014-03-22 NOTE — Telephone Encounter (Signed)
Relevant patient education assigned to patient using Emmi. ° °

## 2014-06-02 ENCOUNTER — Encounter: Payer: Self-pay | Admitting: Internal Medicine

## 2014-06-02 ENCOUNTER — Ambulatory Visit (INDEPENDENT_AMBULATORY_CARE_PROVIDER_SITE_OTHER): Payer: BC Managed Care – PPO | Admitting: Internal Medicine

## 2014-06-02 VITALS — BP 112/78 | HR 68 | Temp 98.6°F | Ht 75.0 in | Wt 239.0 lb

## 2014-06-02 DIAGNOSIS — Z23 Encounter for immunization: Secondary | ICD-10-CM

## 2014-06-02 DIAGNOSIS — I10 Essential (primary) hypertension: Secondary | ICD-10-CM

## 2014-06-02 DIAGNOSIS — N259 Disorder resulting from impaired renal tubular function, unspecified: Secondary | ICD-10-CM

## 2014-06-02 DIAGNOSIS — E119 Type 2 diabetes mellitus without complications: Secondary | ICD-10-CM

## 2014-06-02 LAB — BASIC METABOLIC PANEL
BUN: 18 mg/dL (ref 6–23)
CO2: 27 mEq/L (ref 19–32)
CREATININE: 1.5 mg/dL (ref 0.4–1.5)
Calcium: 9.6 mg/dL (ref 8.4–10.5)
Chloride: 106 mEq/L (ref 96–112)
GFR: 56.35 mL/min — AB (ref >60.00)
Glucose, Bld: 76 mg/dL (ref 70–99)
POTASSIUM: 3.8 meq/L (ref 3.5–5.1)
Sodium: 139 mEq/L (ref 135–145)

## 2014-06-02 LAB — MICROALBUMIN / CREATININE URINE RATIO
CREATININE, U: 253.3 mg/dL
MICROALB UR: 34.2 mg/dL — AB (ref 0.0–1.9)
Microalb Creat Ratio: 13.5 mg/g (ref 0.0–30.0)

## 2014-06-02 LAB — HEMOGLOBIN A1C: Hgb A1c MFr Bld: 6.2 % (ref 4.6–6.5)

## 2014-06-02 MED ORDER — CARVEDILOL 25 MG PO TABS: 25.0000 mg | ORAL_TABLET | Freq: Two times a day (BID) | ORAL | Status: DC

## 2014-06-02 MED ORDER — VALSARTAN 80 MG PO TABS: 80.0000 mg | ORAL_TABLET | Freq: Every day | ORAL | Status: DC

## 2014-06-02 MED ORDER — SIMVASTATIN 40 MG PO TABS: 40.0000 mg | ORAL_TABLET | Freq: Every day | ORAL | Status: DC

## 2014-06-02 NOTE — Assessment & Plan Note (Addendum)
He has worsening renal insufficiency of unclear etiology.  Repeat BMET.  He denies use of OTC NSAIDs or dehydrating illness.    Relative hypotension may be playing a role.  Consider decrease carvedilol to 12.5 mg twice daily.  Check urinalysis

## 2014-06-02 NOTE — Progress Notes (Signed)
Pre visit review using our clinic review tool, if applicable. No additional management support is needed unless otherwise documented below in the visit note. 

## 2014-06-02 NOTE — Assessment & Plan Note (Signed)
BP is low normal.  Consider decrease Coreg dose to 12.5 mg twice daily. BP: 112/78 mmHg

## 2014-06-02 NOTE — Patient Instructions (Signed)
Our office will contact you re: your blood test results

## 2014-06-02 NOTE — Assessment & Plan Note (Signed)
Metformin decreased secondary to worsening renal function.  Repeat A1c.  If worsening blood sugar control.  Consider use DPP4 agent or GLP1 agonist.

## 2014-06-02 NOTE — Progress Notes (Signed)
   Subjective:    Patient ID: Rodney Ortiz, male    DOB: 1976-03-15, 38 y.o.   MRN: [id number]  HPI  38 year old [place] with history of hypertension, chronic renal insufficiency and type 2 diabetes for followup. At previous lab draw his serum creatinine rose to 1.6. Patient denies any dehydration illness or use of over-the-counter NSAIDs. He also denies hypotension.  His metformin was discontinued due to worsening renal insufficiency. However patient has been following a strict carb modified diet and he has been exercising on a regular basis. There has been mild weight loss.   Review of Systems Negative for dizziness or lightheadedness, no dehydrating illness    Past Medical History  Diagnosis Date  . 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 Hyperlipidemia   . Pure hypercholesterolemia   . Unspecified disorder resulting from impaired renal function   . Chronic kidney disease, stage II (mild)   . Hypertension     History   Social History  . Marital Status: Married    Spouse Name: N/A    Number of Children: N/A  . Years of Education: N/A   Occupational History  . Not on file.   Social History Main Topics  . Smoking status: Never Smoker   . Smokeless tobacco: Not on file  . Alcohol Use: No  . Drug Use: No  . Sexual Activity:    Other Topics Concern  . Not on file   Social History Narrative  . No narrative on file    No past surgical history on file.  Family History  Problem Relation Age of Onset  . Hypertension Mother   . Diabetes Mother   . Heart attack Mother     No Known Allergies  Current Outpatient Prescriptions on File Prior to Visit  Medication Sig Dispense Refill  . carvedilol (COREG) 25 MG tablet Take 1 tablet (25 mg total) by mouth 2 (two) times daily with a meal.  180 tablet  1  . simvastatin (ZOCOR) 40 MG tablet Take 1 tablet (40 mg total) by mouth at bedtime.  90 tablet  1  . valsartan (DIOVAN) 80 MG  tablet Take 1 tablet (80 mg total) by mouth daily.  90 tablet  1   No current facility-administered medications on file prior to visit.    BP 112/78  Pulse 68  Temp(Src) 98.6 F (37 C) (Oral)  Ht 6\' 3"  (1.905 m)  Wt 239 lb (108.41 kg)  BMI 29.87 kg/m2    Objective:   Physical Exam  Constitutional: He is oriented to person, place, and time. He appears well-developed and well-nourished. No distress.  Cardiovascular: Normal rate, regular rhythm and normal heart sounds.   No murmur heard. Pulmonary/Chest: Effort normal and breath sounds normal. He has no wheezes.  Musculoskeletal: He exhibits no edema.  Neurological: He is alert and oriented to person, place, and time. No cranial nerve deficit.          Assessment & Plan:

## 2014-06-03 ENCOUNTER — Telehealth: Payer: Self-pay

## 2014-06-03 ENCOUNTER — Telehealth: Payer: Self-pay | Admitting: Internal Medicine

## 2014-06-03 NOTE — Telephone Encounter (Signed)
Left a message for pt to return call 

## 2014-06-03 NOTE — Telephone Encounter (Signed)
Message copied by Azucena FreedMILLNER, Chiffon Kittleson C on Thu Jun 03, 2014  1:19 PM ------      Message from: Meda CoffeeYOO, DOE-HYUN R      Created: Wed Jun 02, 2014  7:37 PM       Call pt - have pt return for UA.  His kidney function slightly improved to 1.5.  He still can not take metformin.  We are going to manage his diabetes with just diet and exercise for now.  I left message on his cell for him to reduce coreg to 12.5 mg twice daily.            I want to see him back in the office in 2 months.  BMET before OV - use renal insuff code.              Thanks             RY ------

## 2014-06-03 NOTE — Telephone Encounter (Signed)
Relevant patient education assigned to patient using Emmi. ° °

## 2014-06-07 ENCOUNTER — Other Ambulatory Visit: Payer: Self-pay | Admitting: Internal Medicine

## 2014-06-07 DIAGNOSIS — N289 Disorder of kidney and ureter, unspecified: Secondary | ICD-10-CM

## 2014-06-07 NOTE — Telephone Encounter (Signed)
Spoke with pt on 7/2 and pt states he was out of town and would come into the office this week.

## 2014-06-09 NOTE — Telephone Encounter (Signed)
Returned pt's call and left a message for pt to return call to set up lab appointment for urine specimen.

## 2014-09-06 ENCOUNTER — Ambulatory Visit: Payer: BC Managed Care – PPO | Admitting: Internal Medicine

## 2014-09-06 ENCOUNTER — Encounter: Payer: Self-pay | Admitting: *Deleted

## 2014-09-06 ENCOUNTER — Other Ambulatory Visit (INDEPENDENT_AMBULATORY_CARE_PROVIDER_SITE_OTHER): Payer: BC Managed Care – PPO

## 2014-09-06 DIAGNOSIS — N259 Disorder resulting from impaired renal tubular function, unspecified: Secondary | ICD-10-CM

## 2014-09-06 DIAGNOSIS — N289 Disorder of kidney and ureter, unspecified: Secondary | ICD-10-CM

## 2014-09-06 LAB — URINALYSIS, ROUTINE W REFLEX MICROSCOPIC
Bilirubin Urine: NEGATIVE
Hgb urine dipstick: NEGATIVE
Ketones, ur: NEGATIVE
Leukocytes, UA: NEGATIVE
NITRITE: NEGATIVE
PH: 6 (ref 5.0–8.0)
RBC / HPF: NONE SEEN (ref 0–?)
Specific Gravity, Urine: 1.025 (ref 1.000–1.030)
TOTAL PROTEIN, URINE-UPE24: 100 — AB
Urine Glucose: NEGATIVE
Urobilinogen, UA: 0.2 (ref 0.0–1.0)

## 2014-09-06 LAB — BASIC METABOLIC PANEL
BUN: 19 mg/dL (ref 6–23)
CALCIUM: 9.4 mg/dL (ref 8.4–10.5)
CO2: 23 meq/L (ref 19–32)
CREATININE: 1.6 mg/dL — AB (ref 0.4–1.5)
Chloride: 106 mEq/L (ref 96–112)
GFR: 49.95 mL/min — AB (ref >60.00)
Glucose, Bld: 63 mg/dL — ABNORMAL LOW (ref 70–99)
Potassium: 4.1 mEq/L (ref 3.5–5.1)
Sodium: 140 mEq/L (ref 135–145)

## 2014-10-18 ENCOUNTER — Ambulatory Visit (INDEPENDENT_AMBULATORY_CARE_PROVIDER_SITE_OTHER): Payer: BC Managed Care – PPO | Admitting: Internal Medicine

## 2014-10-18 ENCOUNTER — Encounter: Payer: Self-pay | Admitting: Internal Medicine

## 2014-10-18 VITALS — BP 124/76 | HR 80 | Temp 98.6°F | Ht 75.0 in | Wt 242.0 lb

## 2014-10-18 DIAGNOSIS — E119 Type 2 diabetes mellitus without complications: Secondary | ICD-10-CM

## 2014-10-18 DIAGNOSIS — N182 Chronic kidney disease, stage 2 (mild): Secondary | ICD-10-CM

## 2014-10-18 DIAGNOSIS — I10 Essential (primary) hypertension: Secondary | ICD-10-CM

## 2014-10-18 MED ORDER — SIMVASTATIN 20 MG PO TABS: 20.0000 mg | ORAL_TABLET | Freq: Every day | ORAL | Status: DC

## 2014-10-18 MED ORDER — VALSARTAN 80 MG PO TABS: 80.0000 mg | ORAL_TABLET | Freq: Every day | ORAL | Status: DC

## 2014-10-18 MED ORDER — CARVEDILOL 25 MG PO TABS: 12.5000 mg | ORAL_TABLET | Freq: Two times a day (BID) | ORAL | Status: DC

## 2014-10-18 NOTE — Assessment & Plan Note (Signed)
Metformin stopped secondary to renal insufficiency.  Manage with diet and exercise. Monitor A1c before next office visit.

## 2014-10-18 NOTE — Assessment & Plan Note (Signed)
Stable.  No change in medication regimen. BP: 124/76 mmHg

## 2014-10-18 NOTE — Progress Notes (Signed)
Pre visit review using our clinic review tool, if applicable. No additional management support is needed unless otherwise documented below in the visit note. 

## 2014-10-18 NOTE — Progress Notes (Signed)
   Subjective:    Patient ID: Rodney Ortiz, male    DOB: August 31, 1976, 38 y.o.   MRN: [id number]  HPI  38 year old African-American male with history of chronic renal insufficiency, hypertension and abnormal glucose for routine follow-up. Patient denies significant interval medical history. He does not always exercise on a regular basis but is following a healthier diet.  Chronic renal insufficiency-his GFR stable.  Review of Systems Mild weight gain.  He is lifting weights    Past Medical History  Diagnosis Date  . 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 Hyperlipidemia   . Pure hypercholesterolemia   . Unspecified disorder resulting from impaired renal function   . Chronic kidney disease, stage II (mild)   . Hypertension     History   Social History  . Marital Status: Married    Spouse Name: N/A    Number of Children: N/A  . Years of Education: N/A   Occupational History  . Not on file.   Social History Main Topics  . Smoking status: Never Smoker   . Smokeless tobacco: Not on file  . Alcohol Use: No  . Drug Use: No  . Sexual Activity: Not on file   Other Topics Concern  . Not on file   Social History Narrative    No past surgical history on file.  Family History  Problem Relation Age of Onset  . Hypertension Mother   . Diabetes Mother   . Heart attack Mother     No Known Allergies  No current outpatient prescriptions on file prior to visit.   No current facility-administered medications on file prior to visit.    BP 124/76 mmHg  Pulse 80  Temp(Src) 98.6 F (37 C) (Oral)  Ht 6\' 3"  (1.905 m)  Wt 242 lb (109.77 kg)  BMI 30.25 kg/m2    Objective:   Physical Exam  Constitutional: He is oriented to person, place, and time. He appears well-developed and well-nourished. No distress.  Cardiovascular: Normal rate, regular rhythm and normal heart sounds.   No murmur heard. Pulmonary/Chest: Effort normal and breath sounds  normal. He has no wheezes.  Musculoskeletal: He exhibits no edema.  Neurological: He is alert and oriented to person, place, and time. No cranial nerve deficit.  Psychiatric: He has a normal mood and affect. His behavior is normal.          Assessment & Plan:

## 2014-10-18 NOTE — Patient Instructions (Addendum)
Please complete the following lab tests before your next follow up appointment: BMET - 401.9 FLP, LFTs - 401.9 A1c - 790.29

## 2014-10-18 NOTE — Assessment & Plan Note (Signed)
Stable. Continue to monitor q 6 months. Lab Results  Component Value Date   CREATININE 1.6* 09/06/2014

## 2015-02-11 ENCOUNTER — Other Ambulatory Visit (INDEPENDENT_AMBULATORY_CARE_PROVIDER_SITE_OTHER): Payer: BC Managed Care – PPO

## 2015-02-11 DIAGNOSIS — R739 Hyperglycemia, unspecified: Secondary | ICD-10-CM

## 2015-02-11 DIAGNOSIS — I10 Essential (primary) hypertension: Secondary | ICD-10-CM

## 2015-02-11 DIAGNOSIS — E785 Hyperlipidemia, unspecified: Secondary | ICD-10-CM

## 2015-02-11 LAB — LIPID PANEL
CHOL/HDL RATIO: 4
Cholesterol: 135 mg/dL (ref 0–200)
HDL: 34.7 mg/dL — ABNORMAL LOW (ref >39.00)
LDL CALC: 80 mg/dL (ref 0–99)
NonHDL: 100.3
Triglycerides: 104 mg/dL (ref 0.0–149.0)
VLDL: 20.8 mg/dL (ref 0.0–40.0)

## 2015-02-11 LAB — BASIC METABOLIC PANEL
BUN: 18 mg/dL (ref 6–23)
CHLORIDE: 107 meq/L (ref 96–112)
CO2: 27 meq/L (ref 19–32)
Calcium: 9.7 mg/dL (ref 8.4–10.5)
Creatinine, Ser: 1.44 mg/dL (ref 0.40–1.50)
GFR: 57.51 mL/min — ABNORMAL LOW (ref >60.00)
Glucose, Bld: 132 mg/dL — ABNORMAL HIGH (ref 70–99)
POTASSIUM: 3.8 meq/L (ref 3.5–5.1)
SODIUM: 139 meq/L (ref 135–145)

## 2015-02-11 LAB — HEPATIC FUNCTION PANEL
ALBUMIN: 4.2 g/dL (ref 3.5–5.2)
ALT: 23 U/L (ref 0–53)
AST: 20 U/L (ref 0–37)
Alkaline Phosphatase: 57 U/L (ref 39–117)
Bilirubin, Direct: 0.2 mg/dL (ref 0.0–0.3)
Total Bilirubin: 1.1 mg/dL (ref 0.2–1.2)
Total Protein: 7.3 g/dL (ref 6.0–8.3)

## 2015-02-11 LAB — HEMOGLOBIN A1C: Hgb A1c MFr Bld: 6.7 % — ABNORMAL HIGH (ref 4.6–6.5)

## 2015-02-18 ENCOUNTER — Encounter: Payer: Self-pay | Admitting: Internal Medicine

## 2015-02-18 ENCOUNTER — Ambulatory Visit (INDEPENDENT_AMBULATORY_CARE_PROVIDER_SITE_OTHER): Payer: BC Managed Care – PPO | Admitting: Internal Medicine

## 2015-02-18 VITALS — BP 122/88 | HR 71 | Temp 98.7°F | Ht 75.0 in | Wt 247.0 lb

## 2015-02-18 DIAGNOSIS — N183 Chronic kidney disease, stage 3 unspecified: Secondary | ICD-10-CM

## 2015-02-18 DIAGNOSIS — N189 Chronic kidney disease, unspecified: Secondary | ICD-10-CM

## 2015-02-18 DIAGNOSIS — E119 Type 2 diabetes mellitus without complications: Secondary | ICD-10-CM

## 2015-02-18 NOTE — Assessment & Plan Note (Signed)
Stable.  I encouraged oral hydration.   BP: 122/88 mmHg  Lab Results  Component Value Date   CREATININE 1.44 02/11/2015

## 2015-02-18 NOTE — Progress Notes (Signed)
   Subjective:    Patient ID: Rodney Ortiz, male    DOB: 12-03-1976, 39 y.o.   MRN: [id number]  HPI  39 year old African [place] male with history of hypertension, hyperlipidemia and chronic renal insufficiency for routine follow-up. We have been following his blood sugars/prediabetes. He reports following a prudent carb modified diet. He is not exercising on a regular basis. His A1c has increased to 6.7.   Review of Systems Negative for chest pain or shortness of breath    Past Medical History  Diagnosis Date  . 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 Hyperlipidemia   . Pure hypercholesterolemia   . Unspecified disorder resulting from impaired renal function   . Chronic kidney disease, stage II (mild)   . Hypertension     History   Social History  . Marital Status: Married    Spouse Name: N/A  . Number of Children: N/A  . Years of Education: N/A   Occupational History  . Not on file.   Social History Main Topics  . Smoking status: Never Smoker   . Smokeless tobacco: Not on file  . Alcohol Use: No  . Drug Use: No  . Sexual Activity: Not on file   Other Topics Concern  . Not on file   Social History Narrative    No past surgical history on file.  Family History  Problem Relation Age of Onset  . Hypertension Mother   . Diabetes Mother   . Heart attack Mother     No Known Allergies  Current Outpatient Prescriptions on File Prior to Visit  Medication Sig Dispense Refill  . carvedilol (COREG) 25 MG tablet Take 0.5 tablets (12.5 mg total) by mouth 2 (two) times daily with a meal. 180 tablet 1  . simvastatin (ZOCOR) 20 MG tablet Take 1 tablet (20 mg total) by mouth at bedtime. 90 tablet 1  . valsartan (DIOVAN) 80 MG tablet Take 1 tablet (80 mg total) by mouth daily. 90 tablet 1   No current facility-administered medications on file prior to visit.    BP 122/88 mmHg  Pulse 71  Temp(Src) 98.7 F (37.1 C) (Oral)  Ht 6\' 3"  (1.905 m)   Wt 247 lb (112.038 kg)  BMI 30.87 kg/m2    Objective:   Physical Exam  Constitutional: He is oriented to person, place, and time. He appears well-developed and well-nourished.  Cardiovascular: Normal rate, regular rhythm and normal heart sounds.   No murmur heard. Pulmonary/Chest: Effort normal and breath sounds normal. He has no wheezes.  Neurological: He is oriented to person, place, and time. No cranial nerve deficit.          Assessment & Plan:

## 2015-02-18 NOTE — Progress Notes (Signed)
Pre visit review using our clinic review tool, if applicable. No additional management support is needed unless otherwise documented below in the visit note. 

## 2015-02-18 NOTE — Assessment & Plan Note (Signed)
A1c trending upward.  I advised additional dietary changes and regular exercise.  If A1c continues to trend upward, consider starting other oral diabetic agents.

## 2015-03-04 ENCOUNTER — Other Ambulatory Visit: Payer: Self-pay | Admitting: Internal Medicine

## 2015-04-22 ENCOUNTER — Other Ambulatory Visit: Payer: Self-pay | Admitting: *Deleted

## 2015-04-22 MED ORDER — CARVEDILOL 25 MG PO TABS: 12.5000 mg | ORAL_TABLET | Freq: Two times a day (BID) | ORAL | Status: DC

## 2015-06-03 ENCOUNTER — Telehealth: Payer: Self-pay | Admitting: Internal Medicine

## 2015-06-03 ENCOUNTER — Other Ambulatory Visit (INDEPENDENT_AMBULATORY_CARE_PROVIDER_SITE_OTHER): Payer: BC Managed Care – PPO

## 2015-06-03 DIAGNOSIS — E119 Type 2 diabetes mellitus without complications: Secondary | ICD-10-CM

## 2015-06-03 LAB — MICROALBUMIN / CREATININE URINE RATIO
CREATININE, U: 343.2 mg/dL
MICROALB UR: 36.5 mg/dL — AB (ref 0.0–1.9)
Microalb Creat Ratio: 10.6 mg/g (ref 0.0–30.0)

## 2015-06-03 LAB — BASIC METABOLIC PANEL
BUN: 18 mg/dL (ref 6–23)
CALCIUM: 9.7 mg/dL (ref 8.4–10.5)
CO2: 26 meq/L (ref 19–32)
Chloride: 107 mEq/L (ref 96–112)
Creatinine, Ser: 1.52 mg/dL — ABNORMAL HIGH (ref 0.40–1.50)
GFR: 53.95 mL/min — ABNORMAL LOW (ref >60.00)
GLUCOSE: 106 mg/dL — AB (ref 70–99)
POTASSIUM: 4.6 meq/L (ref 3.5–5.1)
Sodium: 140 mEq/L (ref 135–145)

## 2015-06-03 LAB — HEMOGLOBIN A1C: Hgb A1c MFr Bld: 5.8 % (ref 4.6–6.5)

## 2015-06-03 MED ORDER — CARVEDILOL 25 MG PO TABS: 12.5000 mg | ORAL_TABLET | Freq: Two times a day (BID) | ORAL | Status: DC

## 2015-06-03 MED ORDER — VALSARTAN 80 MG PO TABS: 80.0000 mg | ORAL_TABLET | Freq: Every day | ORAL | Status: DC

## 2015-06-03 NOTE — Telephone Encounter (Signed)
Pt requesting refill of the following meds:  valsartan (DIOVAN) 80 MG tablet carvedilol (COREG) 25 MG tablet  Please send 90 day supply to [facility].  Pt states he is running low on both meds and is requesting refill to be sent in as soon as possible.

## 2015-06-03 NOTE — Telephone Encounter (Signed)
rx sent in electronically 

## 2015-06-10 ENCOUNTER — Encounter: Payer: Self-pay | Admitting: Internal Medicine

## 2015-06-10 ENCOUNTER — Ambulatory Visit (INDEPENDENT_AMBULATORY_CARE_PROVIDER_SITE_OTHER): Payer: BC Managed Care – PPO | Admitting: Internal Medicine

## 2015-06-10 VITALS — BP 126/80 | HR 94 | Temp 98.9°F | Ht 75.0 in | Wt 239.0 lb

## 2015-06-10 DIAGNOSIS — E119 Type 2 diabetes mellitus without complications: Secondary | ICD-10-CM | POA: Diagnosis not present

## 2015-06-10 MED ORDER — SIMVASTATIN 20 MG PO TABS: 20.0000 mg | ORAL_TABLET | Freq: Every day | ORAL | Status: DC

## 2015-06-10 MED ORDER — CARVEDILOL 25 MG PO TABS: 25.0000 mg | ORAL_TABLET | Freq: Two times a day (BID) | ORAL | Status: DC

## 2015-06-10 MED ORDER — VALSARTAN 80 MG PO TABS: 80.0000 mg | ORAL_TABLET | Freq: Every day | ORAL | Status: DC

## 2015-06-10 NOTE — Assessment & Plan Note (Addendum)
Diabetes control improved with life style changes.  Avoid metformin due to hx of CKD.  Patient encouraged to continue regular exercise and low carb diet.

## 2015-06-10 NOTE — Patient Instructions (Signed)
Please complete the following lab tests before your next follow up appointment: BMET, A1c - 250.00 FLP, LFTs - 272.4 

## 2015-06-10 NOTE — Progress Notes (Signed)
   Subjective:    Patient ID: Rodney Ortiz, male    DOB: 09-20-1976, 39 y.o.   MRN: [id number]  HPI  39 year old African-American male with history of type 2 diabetes, hypertension and chronic kidney disease stage III for routine follow-up. Since previous visit patient has made significant left bowel changes. He is exercising on a regular basis and following low-carb diet. He discontinued consuming all soft drinks.  His A1c improved  CKD - Cr stable.  He drinks 8-10 glasses of water per day.  Review of Systems  Negative for chest pain or shortness of breath  Past Medical History  Diagnosis Date  . 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 Hyperlipidemia   . Pure hypercholesterolemia   . Chronic kidney disease (CKD), stage III (moderate)   . Hypertension   . Diabetes mellitus type 2, controlled     History   Social History  . Marital Status: Married    Spouse Name: N/A  . Number of Children: N/A  . Years of Education: N/A   Occupational History  . Not on file.   Social History Main Topics  . Smoking status: Never Smoker   . Smokeless tobacco: Not on file  . Alcohol Use: No  . Drug Use: No  . Sexual Activity: Not on file   Other Topics Concern  . Not on file   Social History Narrative    No past surgical history on file.  Family History  Problem Relation Age of Onset  . Hypertension Mother   . Diabetes Mother   . Heart attack Mother     No Known Allergies  No current outpatient prescriptions on file prior to visit.   No current facility-administered medications on file prior to visit.    BP 126/80 mmHg  Pulse 94  Temp(Src) 98.9 F (37.2 C) (Oral)  Ht 6\' 3"  (1.905 m)  Wt 239 lb (108.41 kg)  BMI 29.87 kg/m2  Lab Results  Component Value Date   HGBA1C 5.8 06/03/2015   HGBA1C 6.7* 02/11/2015   HGBA1C 6.2 06/02/2014   Lab Results  Component Value Date   MICROALBUR 36.5* 06/03/2015   LDLCALC 80 02/11/2015   CREATININE  1.52* 06/03/2015   Lab Results  Component Value Date   NA 140 06/03/2015   K 4.6 06/03/2015   CL 107 06/03/2015   CO2 26 06/03/2015   Lab Results  Component Value Date   CREATININE 1.52* 06/03/2015      Objective:   Physical Exam  Constitutional: He is oriented to person, place, and time. He appears well-developed and well-nourished. No distress.  Cardiovascular: Normal rate, regular rhythm and normal heart sounds.   No murmur heard. Pulmonary/Chest: Effort normal and breath sounds normal. He has no wheezes.  Musculoskeletal: He exhibits no edema.  Neurological: He is alert and oriented to person, place, and time.  Psychiatric: He has a normal mood and affect. His behavior is normal.          Assessment & Plan:

## 2015-09-01 ENCOUNTER — Telehealth: Payer: Self-pay | Admitting: Internal Medicine

## 2015-09-01 ENCOUNTER — Other Ambulatory Visit: Payer: Self-pay | Admitting: Internal Medicine

## 2015-09-01 MED ORDER — VALSARTAN 80 MG PO TABS: 80.0000 mg | ORAL_TABLET | Freq: Every day | ORAL | Status: DC

## 2015-09-01 NOTE — Telephone Encounter (Signed)
Rx sent 

## 2015-09-01 NOTE — Telephone Encounter (Signed)
Pt needs 10 day refill for valsartan (DIOVAN) 80 MG tablet to be sent to local pharmacy. He will run out before mail order will get him the #90.  [facility] on [address] in [place].

## 2015-12-08 ENCOUNTER — Telehealth: Payer: Self-pay | Admitting: Internal Medicine

## 2015-12-08 ENCOUNTER — Other Ambulatory Visit: Payer: Self-pay | Admitting: Internal Medicine

## 2015-12-08 ENCOUNTER — Encounter: Payer: Self-pay | Admitting: Internal Medicine

## 2015-12-08 DIAGNOSIS — E785 Hyperlipidemia, unspecified: Secondary | ICD-10-CM

## 2015-12-08 DIAGNOSIS — E119 Type 2 diabetes mellitus without complications: Secondary | ICD-10-CM

## 2015-12-08 MED ORDER — CARVEDILOL 25 MG PO TABS: 25.0000 mg | ORAL_TABLET | Freq: Two times a day (BID) | ORAL | Status: DC

## 2015-12-08 MED ORDER — VALSARTAN 80 MG PO TABS: 80.0000 mg | ORAL_TABLET | Freq: Every day | ORAL | Status: DC

## 2015-12-08 NOTE — Telephone Encounter (Signed)
Left a message for patient on MyChart for patient to call office and make a lab appointment.  Labs ordered.

## 2015-12-08 NOTE — Telephone Encounter (Signed)
Pt said he should have labs before he see Dr Artist PaisYoo if this is true can labs be placed in Epic and I will call the pt back and schedule an appt.

## 2015-12-09 ENCOUNTER — Ambulatory Visit: Payer: BC Managed Care – PPO | Admitting: Internal Medicine

## 2015-12-13 ENCOUNTER — Other Ambulatory Visit: Payer: BC Managed Care – PPO

## 2015-12-14 ENCOUNTER — Other Ambulatory Visit (INDEPENDENT_AMBULATORY_CARE_PROVIDER_SITE_OTHER): Payer: BC Managed Care – PPO

## 2015-12-14 DIAGNOSIS — E119 Type 2 diabetes mellitus without complications: Secondary | ICD-10-CM | POA: Diagnosis not present

## 2015-12-14 DIAGNOSIS — E785 Hyperlipidemia, unspecified: Secondary | ICD-10-CM | POA: Diagnosis not present

## 2015-12-14 LAB — HEPATIC FUNCTION PANEL
ALBUMIN: 4.1 g/dL (ref 3.5–5.2)
ALK PHOS: 64 U/L (ref 39–117)
ALT: 20 U/L (ref 0–53)
AST: 20 U/L (ref 0–37)
Bilirubin, Direct: 0.2 mg/dL (ref 0.0–0.3)
Total Bilirubin: 1.1 mg/dL (ref 0.2–1.2)
Total Protein: 7.2 g/dL (ref 6.0–8.3)

## 2015-12-14 LAB — BASIC METABOLIC PANEL
BUN: 16 mg/dL (ref 6–23)
CO2: 28 meq/L (ref 19–32)
Calcium: 9.6 mg/dL (ref 8.4–10.5)
Chloride: 108 mEq/L (ref 96–112)
Creatinine, Ser: 1.58 mg/dL — ABNORMAL HIGH (ref 0.40–1.50)
GFR: 62.26 mL/min (ref >60.00)
GLUCOSE: 121 mg/dL — AB (ref 70–99)
Potassium: 4.4 mEq/L (ref 3.5–5.1)
Sodium: 142 mEq/L (ref 135–145)

## 2015-12-14 LAB — HEMOGLOBIN A1C: HEMOGLOBIN A1C: 6.3 % (ref 4.6–6.5)

## 2015-12-14 LAB — LIPID PANEL
Cholesterol: 123 mg/dL (ref 0–200)
HDL: 30.6 mg/dL — ABNORMAL LOW (ref >39.00)
LDL CALC: 73 mg/dL (ref 0–99)
NONHDL: 92.33
Total CHOL/HDL Ratio: 4
Triglycerides: 97 mg/dL (ref 0.0–149.0)
VLDL: 19.4 mg/dL (ref 0.0–40.0)

## 2015-12-16 ENCOUNTER — Encounter: Payer: Self-pay | Admitting: Internal Medicine

## 2015-12-16 ENCOUNTER — Ambulatory Visit (INDEPENDENT_AMBULATORY_CARE_PROVIDER_SITE_OTHER): Payer: BC Managed Care – PPO | Admitting: Internal Medicine

## 2015-12-16 VITALS — BP 120/88 | HR 64 | Temp 98.3°F | Wt 242.0 lb

## 2015-12-16 DIAGNOSIS — E119 Type 2 diabetes mellitus without complications: Secondary | ICD-10-CM

## 2015-12-16 DIAGNOSIS — I10 Essential (primary) hypertension: Secondary | ICD-10-CM

## 2015-12-16 DIAGNOSIS — N189 Chronic kidney disease, unspecified: Secondary | ICD-10-CM

## 2015-12-16 DIAGNOSIS — R040 Epistaxis: Secondary | ICD-10-CM | POA: Diagnosis not present

## 2015-12-16 DIAGNOSIS — Z23 Encounter for immunization: Secondary | ICD-10-CM

## 2015-12-16 DIAGNOSIS — N183 Chronic kidney disease, stage 3 unspecified: Secondary | ICD-10-CM

## 2015-12-16 MED ORDER — SIMVASTATIN 20 MG PO TABS: 20.0000 mg | ORAL_TABLET | Freq: Every day | ORAL | Status: DC

## 2015-12-16 MED ORDER — CARVEDILOL 25 MG PO TABS: 25.0000 mg | ORAL_TABLET | Freq: Two times a day (BID) | ORAL | Status: DC

## 2015-12-16 MED ORDER — VALSARTAN 80 MG PO TABS: 80.0000 mg | ORAL_TABLET | Freq: Every day | ORAL | Status: DC

## 2015-12-16 NOTE — Progress Notes (Signed)
Subjective:    Patient ID: Rodney Ortiz, male    DOB: [birth year], 40 y.o.   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 Patient denies any significant interval medical history. He thinks he gained proximally 5 pounds over the holidays. He does not exercise on a regular basis. His A1c slightly worse at 6.3.  Patient also complains of mild nosebleeds over the last several weeks.   Review of Systems Negative for chest pain, negative for shortness of breath    Past Medical History  Diagnosis Date  . 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 Hyperlipidemia   . Pure hypercholesterolemia   . Chronic kidney disease (CKD), stage III (moderate)   . Hypertension   . Diabetes mellitus type 2, controlled (HCC)     Social History   Social History  . Marital Status: Married    Spouse Name: N/A  . Number of Children: N/A  . Years of Education: N/A   Occupational History  . Not on file.   Social History Main Topics  . Smoking status: Never Smoker   . Smokeless tobacco: Not on file  . Alcohol Use: No  . Drug Use: No  . Sexual Activity: Not on file   Other Topics Concern  . Not on file   Social History Narrative    No past surgical history on file.  Family History  Problem Relation Age of Onset  . Hypertension Mother   . Diabetes Mother   . Heart attack Mother     No Known Allergies  Current Outpatient Prescriptions on File Prior to Visit  Medication Sig Dispense Refill  . carvedilol (COREG) 25 MG tablet Take 1 tablet (25 mg total) by mouth 2 (two) times daily with a meal. 60 tablet 3  . simvastatin (ZOCOR) 20 MG tablet Take 1 tablet (20 mg total) by mouth at bedtime. 90 tablet 3  . valsartan (DIOVAN) 80 MG tablet TAKE 1 TABLET DAILY 90 tablet 0   No current facility-administered  medications on file prior to visit.    BP 120/88 mmHg  Pulse 64  Temp(Src) 98.3 F (36.8 C) (Oral)  Wt 242 lb (109.77 kg)  Wt Readings from Last 3 Encounters:  12/16/15 242 lb (109.77 kg)  06/10/15 239 lb (108.41 kg)  02/18/15 247 lb (112.038 kg)   Lab Results  Component Value Date   HGBA1C 6.3 12/14/2015   HGBA1C 5.8 06/03/2015   HGBA1C 6.7* 02/11/2015   Lab Results  Component Value Date   MICROALBUR 36.5* 06/03/2015   LDLCALC 73 12/14/2015   CREATININE 1.58* 12/14/2015    Objective:   Physical Exam  Constitutional: He is oriented to person, place, and time. He appears well-developed and well-nourished. No distress.  HENT:  Head: Normocephalic and atraumatic.  Bilateral friable nasal turbinates  Cardiovascular: Normal rate, regular rhythm and normal heart sounds.   No murmur heard. Pulmonary/Chest: Effort normal and breath sounds normal. He has no wheezes.  Neurological: He is alert and oriented to person, place, and time. No cranial nerve deficit.  Psychiatric: He has a normal mood and affect. His behavior is normal.       Assessment & Plan:    1.  Diabetes Mellitus Type II - controlled without complications 2.  Hypertension 3.  Chronic renal insufficieny - moderate (presumed hypertensive) 4.  Mild epistaxis  Patients tend  to diabetes still controlled with diet and exercise. Defer using other oral agents for now. His blood pressure is well controlled continue same dose of valsartan and carvedilol. Chronic renal insufficiency is stable. Patient has minor intermittent epistaxis. Instructions for local measures discussed with patient

## 2015-12-16 NOTE — Patient Instructions (Signed)
Please complete the following lab tests before your next follow up appointment: BMET, A1c - 401.9, 790.29  

## 2015-12-16 NOTE — Progress Notes (Signed)
Pre visit review using our clinic review tool, if applicable. No additional management support is needed unless otherwise documented below in the visit note. 

## 2016-02-28 LAB — HM DIABETES EYE EXAM

## 2016-02-29 ENCOUNTER — Encounter: Payer: Self-pay | Admitting: Internal Medicine

## 2016-05-04 ENCOUNTER — Telehealth: Payer: Self-pay | Admitting: Internal Medicine

## 2016-05-04 NOTE — Telephone Encounter (Signed)
Noted. Thanks. Will order then.

## 2016-05-04 NOTE — Telephone Encounter (Signed)
Pt is transfer from Dr. Artist PaisYoo to Dr Sharen HonesGutierrez. He would like labs prior to his new patient appt on 6/16 to check lipid, a1c, etc at the visit. He is scheduled for labs 6/13.

## 2016-05-14 ENCOUNTER — Other Ambulatory Visit: Payer: Self-pay | Admitting: Family Medicine

## 2016-05-14 DIAGNOSIS — E119 Type 2 diabetes mellitus without complications: Secondary | ICD-10-CM

## 2016-05-14 DIAGNOSIS — N183 Chronic kidney disease, stage 3 unspecified: Secondary | ICD-10-CM

## 2016-05-14 DIAGNOSIS — I10 Essential (primary) hypertension: Secondary | ICD-10-CM

## 2016-05-14 DIAGNOSIS — E785 Hyperlipidemia, unspecified: Secondary | ICD-10-CM

## 2016-05-15 ENCOUNTER — Other Ambulatory Visit (INDEPENDENT_AMBULATORY_CARE_PROVIDER_SITE_OTHER): Payer: BC Managed Care – PPO

## 2016-05-15 DIAGNOSIS — N183 Chronic kidney disease, stage 3 unspecified: Secondary | ICD-10-CM

## 2016-05-15 DIAGNOSIS — N189 Chronic kidney disease, unspecified: Secondary | ICD-10-CM | POA: Diagnosis not present

## 2016-05-15 DIAGNOSIS — E785 Hyperlipidemia, unspecified: Secondary | ICD-10-CM | POA: Diagnosis not present

## 2016-05-15 DIAGNOSIS — E119 Type 2 diabetes mellitus without complications: Secondary | ICD-10-CM | POA: Diagnosis not present

## 2016-05-15 LAB — CBC WITH DIFFERENTIAL/PLATELET
BASOS PCT: 0.3 % (ref 0.0–3.0)
Basophils Absolute: 0 10*3/uL (ref 0.0–0.1)
EOS PCT: 7.7 % — AB (ref 0.0–5.0)
Eosinophils Absolute: 0.4 10*3/uL (ref 0.0–0.7)
HCT: 42.2 % (ref 39.0–52.0)
Hemoglobin: 14.1 g/dL (ref 13.0–17.0)
LYMPHS ABS: 1.5 10*3/uL (ref 0.7–4.0)
Lymphocytes Relative: 30.5 % (ref 12.0–46.0)
MCHC: 33.5 g/dL (ref 30.0–36.0)
MCV: 80.7 fl (ref 78.0–100.0)
MONOS PCT: 7.9 % (ref 3.0–12.0)
Monocytes Absolute: 0.4 10*3/uL (ref 0.1–1.0)
NEUTROS ABS: 2.6 10*3/uL (ref 1.4–7.7)
NEUTROS PCT: 53.6 % (ref 43.0–77.0)
PLATELETS: 164 10*3/uL (ref 150.0–400.0)
RBC: 5.23 Mil/uL (ref 4.22–5.81)
RDW: 13 % (ref 11.5–15.5)
WBC: 4.9 10*3/uL (ref 4.0–10.5)

## 2016-05-15 LAB — RENAL FUNCTION PANEL
ALBUMIN: 4.2 g/dL (ref 3.5–5.2)
BUN: 21 mg/dL (ref 6–23)
CALCIUM: 9.6 mg/dL (ref 8.4–10.5)
CO2: 26 mEq/L (ref 19–32)
CREATININE: 1.51 mg/dL — AB (ref 0.40–1.50)
Chloride: 107 mEq/L (ref 96–112)
GFR: 65.47 mL/min (ref >60.00)
GLUCOSE: 142 mg/dL — AB (ref 70–99)
Phosphorus: 3.2 mg/dL (ref 2.3–4.6)
Potassium: 4.3 mEq/L (ref 3.5–5.1)
Sodium: 140 mEq/L (ref 135–145)

## 2016-05-15 LAB — LIPID PANEL
CHOL/HDL RATIO: 4
Cholesterol: 135 mg/dL (ref 0–200)
HDL: 34.8 mg/dL — AB (ref >39.00)
LDL Cholesterol: 80 mg/dL (ref 0–99)
NonHDL: 100.54
TRIGLYCERIDES: 104 mg/dL (ref 0.0–149.0)
VLDL: 20.8 mg/dL (ref 0.0–40.0)

## 2016-05-15 LAB — HEMOGLOBIN A1C: Hgb A1c MFr Bld: 6.5 % (ref 4.6–6.5)

## 2016-05-15 LAB — VITAMIN D 25 HYDROXY (VIT D DEFICIENCY, FRACTURES): VITD: 19.59 ng/mL — ABNORMAL LOW (ref 30.00–100.00)

## 2016-05-18 ENCOUNTER — Encounter: Payer: Self-pay | Admitting: Family Medicine

## 2016-05-18 ENCOUNTER — Ambulatory Visit (INDEPENDENT_AMBULATORY_CARE_PROVIDER_SITE_OTHER): Payer: BC Managed Care – PPO | Admitting: Family Medicine

## 2016-05-18 VITALS — BP 128/82 | HR 100 | Temp 98.7°F | Ht 75.0 in | Wt 251.5 lb

## 2016-05-18 DIAGNOSIS — I10 Essential (primary) hypertension: Secondary | ICD-10-CM

## 2016-05-18 DIAGNOSIS — N189 Chronic kidney disease, unspecified: Secondary | ICD-10-CM

## 2016-05-18 DIAGNOSIS — N183 Chronic kidney disease, stage 3 unspecified: Secondary | ICD-10-CM

## 2016-05-18 DIAGNOSIS — N2889 Other specified disorders of kidney and ureter: Secondary | ICD-10-CM

## 2016-05-18 DIAGNOSIS — E119 Type 2 diabetes mellitus without complications: Secondary | ICD-10-CM

## 2016-05-18 DIAGNOSIS — E785 Hyperlipidemia, unspecified: Secondary | ICD-10-CM

## 2016-05-18 DIAGNOSIS — E66811 Obesity, class 1: Secondary | ICD-10-CM

## 2016-05-18 DIAGNOSIS — E663 Overweight: Secondary | ICD-10-CM | POA: Insufficient documentation

## 2016-05-18 DIAGNOSIS — Z0000 Encounter for general adult medical examination without abnormal findings: Secondary | ICD-10-CM | POA: Diagnosis not present

## 2016-05-18 DIAGNOSIS — E669 Obesity, unspecified: Secondary | ICD-10-CM | POA: Diagnosis not present

## 2016-05-18 MED ORDER — CARVEDILOL 25 MG PO TABS: 25.0000 mg | ORAL_TABLET | Freq: Two times a day (BID) | ORAL | Status: DC

## 2016-05-18 MED ORDER — VALSARTAN 80 MG PO TABS: 80.0000 mg | ORAL_TABLET | Freq: Every day | ORAL | Status: DC

## 2016-05-18 MED ORDER — SIMVASTATIN 20 MG PO TABS: 20.0000 mg | ORAL_TABLET | Freq: Every day | ORAL | Status: DC

## 2016-05-18 NOTE — Assessment & Plan Note (Signed)
Chronic, stable. Continue current regimen. 

## 2016-05-18 NOTE — Progress Notes (Signed)
Pre visit review using our clinic review tool, if applicable. No additional management support is needed unless otherwise documented below in the visit note. 

## 2016-05-18 NOTE — Assessment & Plan Note (Signed)
Chronic, stable. Continue current diet control.

## 2016-05-18 NOTE — Assessment & Plan Note (Signed)
Discussed healthy diet and lifestyle changes to affect sustainable weight loss  

## 2016-05-18 NOTE — Progress Notes (Signed)
BP 128/82 mmHg  Pulse 100  Temp(Src) 98.7 F (37.1 C) (Oral)  Ht  (1.905 m)  Wt 251 lb 8 oz (114.08 kg)  BMI 31.44 kg/m2   CC: CPE, transfer from Dr Artist Pais Subjective:    Patient ID: Rodney Ortiz, male    DOB: [birth year], 40 y.o.   MRN: [id number]  HPI: Rodney Ortiz is a 40 y.o. male presenting on 05/18/2016 for Establish Care   Prior saw Dr Artist Pais, requests CPE today. Stable on current regimen.   Preventative: Flu shot yearly Tdap 2015 pneumovax 2015 Seat belt use discussed No changing moles on skin  Lives with wife Alden Server) Occ: Tour manager in [employer] Activity: walking 1/2 mile 2 times a week  Diet: good water, fruits/vegetables daily   Relevant past medical, surgical, family and social history reviewed and updated as indicated. Interim medical history since our last visit reviewed. Allergies and medications reviewed and updated. No current outpatient prescriptions on file prior to visit.   No current facility-administered medications on file prior to visit.    Review of Systems  Constitutional: Negative for fever, chills, activity change, appetite change, fatigue and unexpected weight change.  HENT: Negative for hearing loss.   Eyes: Negative for visual disturbance.  Respiratory: Negative for cough, chest tightness, shortness of breath and wheezing.   Cardiovascular: Negative for chest pain, palpitations and leg swelling.  Gastrointestinal: Negative for nausea, vomiting, abdominal pain, diarrhea, constipation, blood in stool and abdominal distention.  Genitourinary: Negative for hematuria and difficulty urinating.  Musculoskeletal: Negative for myalgias, arthralgias and neck pain.  Skin: Negative for rash.  Neurological: Negative for dizziness, seizures, syncope and headaches.  Hematological: Negative for adenopathy. Does not bruise/bleed easily.  Psychiatric/Behavioral: Negative for dysphoric mood. The patient is not nervous/anxious.    Per HPI unless  specifically indicated in ROS section     Objective:    BP 128/82 mmHg  Pulse 100  Temp(Src) 98.7 F (37.1 C) (Oral)  Ht  (1.905 m)  Wt 251 lb 8 oz (114.08 kg)  BMI 31.44 kg/m2  Wt Readings from Last 3 Encounters:  05/18/16 251 lb 8 oz (114.08 kg)  12/16/15 242 lb (109.77 kg)  06/10/15 239 lb (108.41 kg)   Physical Exam  Constitutional: He is oriented to person, place, and time. He appears well-developed and well-nourished. No distress.  HENT:  Head: Normocephalic and atraumatic.  Right Ear: Hearing, tympanic membrane, external ear and ear canal normal.  Left Ear: Hearing, tympanic membrane, external ear and ear canal normal.  Nose: Nose normal.  Mouth/Throat: Uvula is midline, oropharynx is clear and moist and mucous membranes are normal. No oropharyngeal exudate, posterior oropharyngeal edema or posterior oropharyngeal erythema.  Eyes: Conjunctivae and EOM are normal. Pupils are equal, round, and reactive to light. No scleral icterus.  Neck: Normal range of motion. Neck supple.  Cardiovascular: Normal rate, regular rhythm, normal heart sounds and intact distal pulses.   No murmur heard. Pulses:      Radial pulses are 2+ on the right side, and 2+ on the left side.  Pulmonary/Chest: Effort normal and breath sounds normal. No respiratory distress. He has no wheezes. He has no rales.  Abdominal: Soft. Bowel sounds are normal. He exhibits no distension and no mass. There is no tenderness. There is no rebound and no guarding.  Musculoskeletal: Normal range of motion. He exhibits no edema.  Lymphadenopathy:    He has no cervical adenopathy.  Neurological: He is alert and oriented  to person, place, and time.  CN grossly intact, station and gait intact  Skin: Skin is warm and dry. No rash noted.  Psychiatric: He has a normal mood and affect. His behavior is normal. Judgment and thought content normal.  Nursing note and vitals reviewed.  Results for orders placed or performed  in visit on 05/15/16  Lipid panel  Result Value Ref Range   Cholesterol 135 0 - 200 mg/dL   Triglycerides 960.4 0.0 - 149.0 mg/dL   HDL 54.09 (L) >81.19 mg/dL   VLDL 14.7 0.0 - 82.9 mg/dL   LDL Cholesterol 80 0 - 99 mg/dL   Total CHOL/HDL Ratio 4    NonHDL 100.54   Hemoglobin A1c  Result Value Ref Range   Hgb A1c MFr Bld 6.5 4.6 - 6.5 %  CBC with Differential/Platelet  Result Value Ref Range   WBC 4.9 4.0 - 10.5 K/uL   RBC 5.23 4.22 - 5.81 Mil/uL   Hemoglobin 14.1 13.0 - 17.0 g/dL   HCT 56.2 13.0 - 86.5 %   MCV 80.7 78.0 - 100.0 fl   MCHC 33.5 30.0 - 36.0 g/dL   RDW 78.4 69.6 - 29.5 %   Platelets 164.0 150.0 - 400.0 K/uL   Neutrophils Relative % 53.6 43.0 - 77.0 %   Lymphocytes Relative 30.5 12.0 - 46.0 %   Monocytes Relative 7.9 3.0 - 12.0 %   Eosinophils Relative 7.7 (H) 0.0 - 5.0 %   Basophils Relative 0.3 0.0 - 3.0 %   Neutro Abs 2.6 1.4 - 7.7 K/uL   Lymphs Abs 1.5 0.7 - 4.0 K/uL   Monocytes Absolute 0.4 0.1 - 1.0 K/uL   Eosinophils Absolute 0.4 0.0 - 0.7 K/uL   Basophils Absolute 0.0 0.0 - 0.1 K/uL  Renal function panel  Result Value Ref Range   Sodium 140 135 - 145 mEq/L   Potassium 4.3 3.5 - 5.1 mEq/L   Chloride 107 96 - 112 mEq/L   CO2 26 19 - 32 mEq/L   Calcium 9.6 8.4 - 10.5 mg/dL   Albumin 4.2 3.5 - 5.2 g/dL   BUN 21 6 - 23 mg/dL   Creatinine, Ser 2.84 (H) 0.40 - 1.50 mg/dL   Glucose, Bld 132 (H) 70 - 99 mg/dL   Phosphorus 3.2 2.3 - 4.6 mg/dL   GFR 44.01 >02.72 mL/min  VITAMIN D 25 Hydroxy (Vit-D Deficiency, Fractures)  Result Value Ref Range   VITD 19.59 (L) 30.00 - 100.00 ng/mL      Assessment & Plan:   Problem List Items Addressed This Visit    HLD (hyperlipidemia)    Chronic, stable. Continue simvastatin.      Relevant Medications   carvedilol (COREG) 25 MG tablet   valsartan (DIOVAN) 80 MG tablet   simvastatin (ZOCOR) 20 MG tablet   Essential hypertension    Chronic, stable. Continue current regimen.       Relevant Medications    carvedilol (COREG) 25 MG tablet   valsartan (DIOVAN) 80 MG tablet   simvastatin (ZOCOR) 20 MG tablet   Chronic renal insufficiency, stage III (moderate)    Chronic, stable. Actually may be stage 2. Check microalb next visit.       Preventative health care - Primary    Preventative protocols reviewed and updated unless pt declined. Discussed healthy diet and lifestyle.       Type 2 diabetes mellitus (HCC)    Chronic, stable. Continue current diet control.       Relevant Medications  valsartan (DIOVAN) 80 MG tablet   simvastatin (ZOCOR) 20 MG tablet   Obesity, Class I, BMI 30-34.9    Discussed healthy diet and lifestyle changes to affect sustainable weight loss.          Follow up plan: Return in about 6 months (around 11/17/2016), or as needed , for follow up visit.  Eustaquio BoydenJavier Franky Reier, MD

## 2016-05-18 NOTE — Assessment & Plan Note (Signed)
Chronic, stable. Continue simvastatin.  

## 2016-05-18 NOTE — Patient Instructions (Addendum)
Start vit D 1000 units daily. Return in 6 months for diabetes follow up. Good to meet you today. You are doing well today. Try to increase regular aerobic exercise   Health Maintenance, Male A healthy lifestyle and preventative care can promote health and wellness.  Maintain regular health, dental, and eye exams.  Eat a healthy diet. Foods like vegetables, fruits, whole grains, low-fat dairy products, and lean protein foods contain the nutrients you need and are low in calories. Decrease your intake of foods high in solid fats, added sugars, and salt. Get information about a proper diet from your health care provider, if necessary.  Regular physical exercise is one of the most important things you can do for your health. Most adults should get at least 150 minutes of moderate-intensity exercise (any activity that increases your heart rate and causes you to sweat) each week. In addition, most adults need muscle-strengthening exercises on 2 or more days a week.   Maintain a healthy weight. The body mass index (BMI) is a screening tool to identify possible weight problems. It provides an estimate of body fat based on height and weight. Your health care provider can find your BMI and can help you achieve or maintain a healthy weight. For males 20 years and older:  A BMI below 18.5 is considered underweight.  A BMI of 18.5 to 24.9 is normal.  A BMI of 25 to 29.9 is considered overweight.  A BMI of 30 and above is considered obese.  Maintain normal blood lipids and cholesterol by exercising and minimizing your intake of saturated fat. Eat a balanced diet with plenty of fruits and vegetables. Blood tests for lipids and cholesterol should begin at age 42 and be repeated every 5 years. If your lipid or cholesterol levels are high, you are over age 150, or you are at high risk for heart disease, you may need your cholesterol levels checked more frequently.Ongoing high lipid and cholesterol levels  should be treated with medicines if diet and exercise are not working.  If you smoke, find out from your health care provider how to quit. If you do not use tobacco, do not start.  Lung cancer screening is recommended for adults aged 55-80 years who are at high risk for developing lung cancer because of a history of smoking. A yearly low-dose CT scan of the lungs is recommended for people who have at least a 30-pack-year history of smoking and are current smokers or have quit within the past 15 years. A pack year of smoking is smoking an average of 1 pack of cigarettes a day for 1 year (for example, a 30-pack-year history of smoking could mean smoking 1 pack a day for 30 years or 2 packs a day for 15 years). Yearly screening should continue until the smoker has stopped smoking for at least 15 years. Yearly screening should be stopped for people who develop a health problem that would prevent them from having lung cancer treatment.  If you choose to drink alcohol, do not have more than 2 drinks per day. One drink is considered to be 12 oz (360 mL) of beer, 5 oz (150 mL) of wine, or 1.5 oz (45 mL) of liquor.  Avoid the use of street drugs. Do not share needles with anyone. Ask for help if you need support or instructions about stopping the use of drugs.  High blood pressure causes heart disease and increases the risk of stroke. High blood pressure is more likely to  develop in:  People who have blood pressure in the end of the normal range (100-139/85-89 mm Hg).  People who are overweight or obese.  People who are African American.  If you are 35-80 years of age, have your blood pressure checked every 3-5 years. If you are 76 years of age or older, have your blood pressure checked every year. You should have your blood pressure measured twice--once when you are at a hospital or clinic, and once when you are not at a hospital or clinic. Record the average of the two measurements. To check your blood  pressure when you are not at a hospital or clinic, you can use:  An automated blood pressure machine at a pharmacy.  A home blood pressure monitor.  If you are 84-84 years old, ask your health care provider if you should take aspirin to prevent heart disease.  Diabetes screening involves taking a blood sample to check your fasting blood sugar level. This should be done once every 3 years after age 87 if you are at a normal weight and without risk factors for diabetes. Testing should be considered at a younger age or be carried out more frequently if you are overweight and have at least 1 risk factor for diabetes.  Colorectal cancer can be detected and often prevented. Most routine colorectal cancer screening begins at the age of 97 and continues through age 63. However, your health care provider may recommend screening at an earlier age if you have risk factors for colon cancer. On a yearly basis, your health care provider may provide home test kits to check for hidden blood in the stool. A small camera at the end of a tube may be used to directly examine the colon (sigmoidoscopy or colonoscopy) to detect the earliest forms of colorectal cancer. Talk to your health care provider about this at age 104 when routine screening begins. A direct exam of the colon should be repeated every 5-10 years through age 45, unless early forms of precancerous polyps or small growths are found.  People who are at an increased risk for hepatitis B should be screened for this virus. You are considered at high risk for hepatitis B if:  You were born in a country where hepatitis B occurs often. Talk with your health care provider about which countries are considered high risk.  Your parents were born in a high-risk country and you have not received a shot to protect against hepatitis B (hepatitis B vaccine).  You have HIV or AIDS.  You use needles to inject street drugs.  You live with, or have sex with, someone who  has hepatitis B.  You are a man who has sex with other men (MSM).  You get hemodialysis treatment.  You take certain medicines for conditions like cancer, organ transplantation, and autoimmune conditions.  Hepatitis C blood testing is recommended for all people born from [birth year] through 1967 and any individual with known risk factors for hepatitis C.  Healthy men should no longer receive prostate-specific antigen (PSA) blood tests as part of routine cancer screening. Talk to your health care provider about prostate cancer screening.  Testicular cancer screening is not recommended for adolescents or adult males who have no symptoms. Screening includes self-exam, a health care provider exam, and other screening tests. Consult with your health care provider about any symptoms you have or any concerns you have about testicular cancer.  Practice safe sex. Use condoms and avoid high-risk sexual practices to reduce  the spread of sexually transmitted infections (STIs).  You should be screened for STIs, including gonorrhea and chlamydia if:  You are sexually active and are younger than 24 years.  You are older than 24 years, and your health care provider tells you that you are at risk for this type of infection.  Your sexual activity has changed since you were last screened, and you are at an increased risk for chlamydia or gonorrhea. Ask your health care provider if you are at risk.  If you are at risk of being infected with HIV, it is recommended that you take a prescription medicine daily to prevent HIV infection. This is called pre-exposure prophylaxis (PrEP). You are considered at risk if:  You are a man who has sex with other men (MSM).  You are a heterosexual man who is sexually active with multiple partners.  You take drugs by injection.  You are sexually active with a partner who has HIV.  Talk with your health care provider about whether you are at high risk of being infected with  HIV. If you choose to begin PrEP, you should first be tested for HIV. You should then be tested every 3 months for as long as you are taking PrEP.  Use sunscreen. Apply sunscreen liberally and repeatedly throughout the day. You should seek shade when your shadow is shorter than you. Protect yourself by wearing long sleeves, pants, a wide-brimmed hat, and sunglasses year round whenever you are outdoors.  Tell your health care provider of new moles or changes in moles, especially if there is a change in shape or color. Also, tell your health care provider if a mole is larger than the size of a pencil eraser.  A one-time screening for abdominal aortic aneurysm (AAA) and surgical repair of large AAAs by ultrasound is recommended for men aged 41-75 years who are current or former smokers.  Stay current with your vaccines (immunizations).   This information is not intended to replace advice given to you by your health care provider. Make sure you discuss any questions you have with your health care provider.   Document Released: 05/17/2008 Document Revised: 12/10/2014 Document Reviewed: 04/16/2011 Elsevier Interactive Patient Education [employer].

## 2016-05-18 NOTE — Assessment & Plan Note (Signed)
Preventative protocols reviewed and updated unless pt declined. Discussed healthy diet and lifestyle.  

## 2016-05-18 NOTE — Assessment & Plan Note (Signed)
Chronic, stable. Actually may be stage 2. Check microalb next visit.

## 2017-03-29 LAB — HM DIABETES EYE EXAM

## 2017-04-12 ENCOUNTER — Encounter: Payer: Self-pay | Admitting: Family Medicine

## 2017-05-04 ENCOUNTER — Other Ambulatory Visit: Payer: Self-pay | Admitting: Family Medicine

## 2017-05-04 DIAGNOSIS — N183 Chronic kidney disease, stage 3 unspecified: Secondary | ICD-10-CM

## 2017-05-04 DIAGNOSIS — E785 Hyperlipidemia, unspecified: Secondary | ICD-10-CM

## 2017-05-04 DIAGNOSIS — E119 Type 2 diabetes mellitus without complications: Secondary | ICD-10-CM

## 2017-05-10 ENCOUNTER — Other Ambulatory Visit (INDEPENDENT_AMBULATORY_CARE_PROVIDER_SITE_OTHER): Payer: BC Managed Care – PPO

## 2017-05-10 DIAGNOSIS — N183 Chronic kidney disease, stage 3 unspecified: Secondary | ICD-10-CM

## 2017-05-10 DIAGNOSIS — E119 Type 2 diabetes mellitus without complications: Secondary | ICD-10-CM

## 2017-05-10 DIAGNOSIS — E785 Hyperlipidemia, unspecified: Secondary | ICD-10-CM

## 2017-05-10 LAB — LIPID PANEL
Cholesterol: 122 mg/dL (ref 0–200)
HDL: 29.9 mg/dL — AB (ref >39.00)
LDL Cholesterol: 71 mg/dL (ref 0–99)
NONHDL: 92.52
Total CHOL/HDL Ratio: 4
Triglycerides: 110 mg/dL (ref 0.0–149.0)
VLDL: 22 mg/dL (ref 0.0–40.0)

## 2017-05-10 LAB — RENAL FUNCTION PANEL
Albumin: 4.1 g/dL (ref 3.5–5.2)
BUN: 20 mg/dL (ref 6–23)
CALCIUM: 9.5 mg/dL (ref 8.4–10.5)
CHLORIDE: 108 meq/L (ref 96–112)
CO2: 26 mEq/L (ref 19–32)
Creatinine, Ser: 1.67 mg/dL — ABNORMAL HIGH (ref 0.40–1.50)
GFR: 58.01 mL/min — AB (ref >60.00)
GLUCOSE: 171 mg/dL — AB (ref 70–99)
POTASSIUM: 4 meq/L (ref 3.5–5.1)
Phosphorus: 3.6 mg/dL (ref 2.3–4.6)
Sodium: 140 mEq/L (ref 135–145)

## 2017-05-10 LAB — HEMOGLOBIN A1C: HEMOGLOBIN A1C: 7.6 % — AB (ref 4.6–6.5)

## 2017-05-10 LAB — VITAMIN D 25 HYDROXY (VIT D DEFICIENCY, FRACTURES): VITD: 24.57 ng/mL — ABNORMAL LOW (ref 30.00–100.00)

## 2017-05-15 ENCOUNTER — Encounter: Payer: Self-pay | Admitting: Family Medicine

## 2017-05-15 ENCOUNTER — Ambulatory Visit (INDEPENDENT_AMBULATORY_CARE_PROVIDER_SITE_OTHER): Payer: BC Managed Care – PPO | Admitting: Family Medicine

## 2017-05-15 VITALS — BP 124/90 | HR 82 | Temp 98.4°F | Ht 74.25 in | Wt 250.0 lb

## 2017-05-15 DIAGNOSIS — E785 Hyperlipidemia, unspecified: Secondary | ICD-10-CM

## 2017-05-15 DIAGNOSIS — IMO0002 Reserved for concepts with insufficient information to code with codable children: Secondary | ICD-10-CM

## 2017-05-15 DIAGNOSIS — Z0000 Encounter for general adult medical examination without abnormal findings: Secondary | ICD-10-CM | POA: Diagnosis not present

## 2017-05-15 DIAGNOSIS — E1121 Type 2 diabetes mellitus with diabetic nephropathy: Secondary | ICD-10-CM | POA: Diagnosis not present

## 2017-05-15 DIAGNOSIS — E1165 Type 2 diabetes mellitus with hyperglycemia: Secondary | ICD-10-CM

## 2017-05-15 DIAGNOSIS — N183 Chronic kidney disease, stage 3 unspecified: Secondary | ICD-10-CM

## 2017-05-15 DIAGNOSIS — E669 Obesity, unspecified: Secondary | ICD-10-CM

## 2017-05-15 DIAGNOSIS — I10 Essential (primary) hypertension: Secondary | ICD-10-CM | POA: Diagnosis not present

## 2017-05-15 MED ORDER — SITAGLIPTIN PHOSPHATE 50 MG PO TABS: 50.0000 mg | ORAL_TABLET | Freq: Every day | ORAL | 4 refills | Status: DC

## 2017-05-15 NOTE — Assessment & Plan Note (Signed)
Chronic, stable. Reviewed readings. Encouraged good hydration status, pt already avoiding NSAIDs.

## 2017-05-15 NOTE — Assessment & Plan Note (Signed)
Chronic, stable. Continue current regimen. 

## 2017-05-15 NOTE — Assessment & Plan Note (Signed)
Discussed healthy diet and lifestyle changes to affect sustainable weight loss  

## 2017-05-15 NOTE — Assessment & Plan Note (Signed)
Chronic, stable. Continue simvastatin. Discussed lifestyle changes to improve HDL.

## 2017-05-15 NOTE — Assessment & Plan Note (Addendum)
Reviewed deteriorated control over the last year.  Avoid metformin given CKD.  Start [place] 50mg  daily. [facility].  He will let me know insurance preferred glucose meter brand to Rx. Discussed starting to monitor blood sugars.  RTC 3-4 months for f/u visit. Pt agrees with plan.

## 2017-05-15 NOTE — Assessment & Plan Note (Signed)
Preventative protocols reviewed and updated unless pt declined. Discussed healthy diet and lifestyle.  

## 2017-05-15 NOTE — Progress Notes (Signed)
BP 124/90   Pulse 82   Temp 98.4 F (36.9 C) (Oral)   Ht 6' 2.25" (1.886 m)   Wt 250 lb (113.4 kg)   BMI 31.88 kg/m   Rpt bp 130/108  CC: CPE Subjective:    Patient ID: Rodney ArCyrus J Whiten, male    DOB: 1976/07/03, 41 y.o.   MRN: [id number]  HPI: Rodney Ortiz is a 41 y.o. male presenting on 05/15/2017 for Annual Exam   DM - regularly does not check sugars. Compliant with antihyperglycemic regimen which includes: diet controlled. Denies low sugars or hypoglycemic symptoms.  Denies paresthesias. Last diabetic eye exam 03/2017.  Pneumovax: done.  Prevnar: not due. He did see nutritionist years ago.  Lab Results  Component Value Date   HGBA1C 7.6 (H) 05/10/2017   Diabetic Foot Exam - Simple   Simple Foot Form Diabetic Foot exam was performed with the following findings:  Yes 05/15/2017 10:06 AM  Visual Inspection No deformities, no ulcerations, no other skin breakdown bilaterally:  Yes Sensation Testing Intact to touch and monofilament testing bilaterally:  Yes Pulse Check Posterior Tibialis and Dorsalis pulse intact bilaterally:  Yes Comments Slightly diminished monofilament      Preventative: Flu shot yearly Tdap 2015 Pneumovax 2015 Seat belt use discussed Sunscreen use discussed. No changing moles on skin.  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 Interim medical history since our last visit reviewed. Allergies and medications reviewed and updated. Outpatient Medications Prior to Visit  Medication Sig Dispense Refill  . carvedilol (COREG) 25 MG tablet Take 1 tablet (25 mg total) by mouth 2 (two) times daily with a meal. 180 tablet 3  . cholecalciferol (VITAMIN D) 1000 units tablet Take 1,000 Units by mouth daily.    . simvastatin (ZOCOR) 20 MG tablet Take 1  tablet (20 mg total) by mouth at bedtime. 90 tablet 3  . valsartan (DIOVAN) 80 MG tablet Take 1 tablet (80 mg total) by mouth daily. 90 tablet 3   No facility-administered medications prior to visit.      Per HPI unless specifically indicated in ROS section below Review of Systems  Constitutional: Negative for activity change, appetite change, chills, fatigue, fever and unexpected weight change.  HENT: Negative for hearing loss.   Eyes: Negative for visual disturbance.  Respiratory: Positive for cough (occasional dry in am). Negative for chest tightness, shortness of breath and wheezing.   Cardiovascular: Negative for chest pain, palpitations and leg swelling.  Gastrointestinal: Negative for abdominal distention, abdominal pain, blood in stool, constipation, diarrhea, nausea and vomiting.  Genitourinary: Negative for difficulty urinating and hematuria.  Musculoskeletal: Negative for arthralgias, myalgias and neck pain.  Skin: Negative for rash.  Neurological: Negative for dizziness, seizures, syncope and headaches.  Hematological: Negative for adenopathy. Does not bruise/bleed easily.  Psychiatric/Behavioral: Negative for dysphoric mood. The patient is not nervous/anxious.        Objective:    BP 124/90   Pulse 82   Temp 98.4 F (36.9 C) (Oral)   Ht 6' 2.25" (1.886 m)   Wt 250 lb (113.4 kg)   BMI 31.88 kg/m   Wt Readings from Last 3 Encounters:  05/15/17 250 lb (113.4 kg)  05/18/16 251 lb 8 oz (114.1 kg)  12/16/15 242 lb (109.8 kg)    Physical Exam  Constitutional: He is oriented to person, place, and time. He appears well-developed and well-nourished. No distress.  HENT:  Head: Normocephalic and atraumatic.  Right Ear: Hearing, tympanic membrane, external ear and ear canal normal.  Left Ear: Hearing, tympanic membrane, external ear and ear canal normal.  Nose: Nose normal.  Mouth/Throat: Uvula is midline, oropharynx is clear and moist and mucous membranes are normal. No  oropharyngeal exudate, posterior oropharyngeal edema or posterior oropharyngeal erythema.  Eyes: Conjunctivae and EOM are normal. Pupils are equal, round, and reactive to light. No scleral icterus.  Neck: Normal range of motion. Neck supple. No thyromegaly present.  Cardiovascular: Normal rate, regular rhythm, normal heart sounds and intact distal pulses.   No murmur heard. Pulses:      Radial pulses are 2+ on the right side, and 2+ on the left side.  Pulmonary/Chest: Effort normal and breath sounds normal. No respiratory distress. He has no wheezes. He has no rales.  Abdominal: Soft. Bowel sounds are normal. He exhibits no distension and no mass. There is no tenderness. There is no rebound and no guarding.  Musculoskeletal: Normal range of motion. He exhibits no edema.  Lymphadenopathy:    He has no cervical adenopathy.  Neurological: He is alert and oriented to person, place, and time.  CN grossly intact, station and gait intact  Skin: Skin is warm and dry. No rash noted.  Psychiatric: He has a normal mood and affect. His behavior is normal. Judgment and thought content normal.  Nursing note and vitals reviewed.  Results for orders placed or performed in visit on 05/10/17  Lipid panel  Result Value Ref Range   Cholesterol 122 0 - 200 mg/dL   Triglycerides 865.7 0.0 - 149.0 mg/dL   HDL 84.69 (L) >62.95 mg/dL   VLDL 28.4 0.0 - 13.2 mg/dL   LDL Cholesterol 71 0 - 99 mg/dL   Total CHOL/HDL Ratio 4    NonHDL 92.52   Renal function panel  Result Value Ref Range   Sodium 140 135 - 145 mEq/L   Potassium 4.0 3.5 - 5.1 mEq/L   Chloride 108 96 - 112 mEq/L   CO2 26 19 - 32 mEq/L   Calcium 9.5 8.4 - 10.5 mg/dL   Albumin 4.1 3.5 - 5.2 g/dL   BUN 20 6 - 23 mg/dL   Creatinine, Ser 4.40 (H) 0.40 - 1.50 mg/dL   Glucose, Bld 102 (H) 70 - 99 mg/dL   Phosphorus 3.6 2.3 - 4.6 mg/dL   GFR 72.53 (L) >66.44 mL/min  Hemoglobin A1c  Result Value Ref Range   Hgb A1c MFr Bld 7.6 (H) 4.6 - 6.5 %    VITAMIN D 25 Hydroxy (Vit-D Deficiency, Fractures)  Result Value Ref Range   VITD 24.57 (L) 30.00 - 100.00 ng/mL      Assessment & Plan:   Problem List Items Addressed This Visit    Chronic renal insufficiency, stage III (moderate)    Chronic, stable. Reviewed readings. Encouraged good hydration status, pt already avoiding NSAIDs.      Essential hypertension    Chronic, stable. Continue current regimen.       HLD (hyperlipidemia)    Chronic, stable. Continue simvastatin. Discussed lifestyle changes to improve HDL.       Obesity, Class I, BMI 30-34.9    Discussed healthy diet and lifestyle changes to affect sustainable weight loss.       Relevant Medications   sitaGLIPtin (JANUVIA) 50 MG tablet   Preventative health care - Primary  Preventative protocols reviewed and updated unless pt declined. Discussed healthy diet and lifestyle.       Uncontrolled type 2 diabetes mellitus with diabetic nephropathy, without long-term current use of insulin (HCC)    Reviewed deteriorated control over the last year.  Avoid metformin given CKD.  Start [place] 50mg  daily. Will refer back for diabetes education at [facility] per pt preference.  He will let me know insurance preferred glucose meter brand to Rx. Discussed starting to monitor blood sugars.  RTC 3-4 months for f/u visit. Pt agrees with plan.       Relevant Medications   sitaGLIPtin (JANUVIA) 50 MG tablet   Other Relevant Orders   Ambulatory referral to diabetic education       Follow up plan: Return in about 3 months (around 08/15/2017) for follow up visit.  Eustaquio Boyden, MD

## 2017-05-15 NOTE — Patient Instructions (Addendum)
Sugars returned elevated this year.  We will refer you back for diabetes classes at [facility].  Start [place] 50mg  daily for sugar control.  Check with insurance about preferred brand for glucose meter and we will send this in to your pharmacy.  Once you get glucose meter, check occasionally either fasting (goal 80-120) or 2 hours after a meal (goal 100-180), or if feeling bad.  Work on regular exercise routine.  Return in 3-4 months for diabetes follow up.   Health Maintenance, Male A healthy lifestyle and preventive care is important for your health and wellness. Ask your health care provider about what schedule of regular examinations is right for you. What should I know about weight and diet? Eat a Healthy Diet  Eat plenty of vegetables, fruits, whole grains, low-fat dairy products, and lean protein.  Do not eat a lot of foods high in solid fats, added sugars, or salt.  Maintain a Healthy Weight Regular exercise can help you achieve or maintain a healthy weight. You should:  Do at least 150 minutes of exercise each week. The exercise should increase your heart rate and make you sweat (moderate-intensity exercise).  Do strength-training exercises at least twice a week.  Watch Your Levels of Cholesterol and Blood Lipids  Have your blood tested for lipids and cholesterol every 5 years starting at 43 years of age. If you are at high risk for heart disease, you should start having your blood tested when you are 43 years old. You may need to have your cholesterol levels checked more often if: ? Your lipid or cholesterol levels are high. ? You are older than 43 years of age. ? You are at high risk for heart disease.  What should I know about cancer screening? Many types of cancers can be detected early and may often be prevented. Lung Cancer  You should be screened every year for lung cancer if: ? You are a current smoker who has smoked for at least 30 years. ? You are a former smoker  who has quit within the past 15 years.  Talk to your health care provider about your screening options, when you should start screening, and how often you should be screened.  Colorectal Cancer  Routine colorectal cancer screening usually begins at 43 years of age and should be repeated every 5-10 years until you are 43 years old. You may need to be screened more often if early forms of precancerous polyps or small growths are found. Your health care provider may recommend screening at an earlier age if you have risk factors for colon cancer.  Your health care provider may recommend using home test kits to check for hidden blood in the stool.  A small camera at the end of a tube can be used to examine your colon (sigmoidoscopy or colonoscopy). This checks for the earliest forms of colorectal cancer.  Prostate and Testicular Cancer  Depending on your age and overall health, your health care provider may do certain tests to screen for prostate and testicular cancer.  Talk to your health care provider about any symptoms or concerns you have about testicular or prostate cancer.  Skin Cancer  Check your skin from head to toe regularly.  Tell your health care provider about any new moles or changes in moles, especially if: ? There is a change in a mole's size, shape, or color. ? You have a mole that is larger than a pencil eraser.  Always use sunscreen. Apply sunscreen liberally  and repeat throughout the day.  Protect yourself by wearing long sleeves, pants, a wide-brimmed hat, and sunglasses when outside.  What should I know about heart disease, diabetes, and high blood pressure?  If you are 6718-43 years of age, have your blood pressure checked every 3-5 years. If you are 43 years of age or older, have your blood pressure checked every year. You should have your blood pressure measured twice-once when you are at a hospital or clinic, and once when you are not at a hospital or clinic. Record  the average of the two measurements. To check your blood pressure when you are not at a hospital or clinic, you can use: ? An automated blood pressure machine at a pharmacy. ? A home blood pressure monitor.  Talk to your health care provider about your target blood pressure.  If you are between 5645-325 years old, ask your health care provider if you should take aspirin to prevent heart disease.  Have regular diabetes screenings by checking your fasting blood sugar level. ? If you are at a normal weight and have a low risk for diabetes, have this test once every three years after the age of 43. ? If you are overweight and have a high risk for diabetes, consider being tested at a younger age or more often.  A one-time screening for abdominal aortic aneurysm (AAA) by ultrasound is recommended for men aged 65-75 years who are current or former smokers. What should I know about preventing infection? Hepatitis B If you have a higher risk for hepatitis B, you should be screened for this virus. Talk with your health care provider to find out if you are at risk for hepatitis B infection. Hepatitis C Blood testing is recommended for:  Everyone born from [birth year] through 1967.  Anyone with known risk factors for hepatitis C.  Sexually Transmitted Diseases (STDs)  You should be screened each year for STDs including gonorrhea and chlamydia if: ? You are sexually active and are younger than 43 years of age. ? You are older than 43 years of age and your health care provider tells you that you are at risk for this type of infection. ? Your sexual activity has changed since you were last screened and you are at an increased risk for chlamydia or gonorrhea. Ask your health care provider if you are at risk.  Talk with your health care provider about whether you are at high risk of being infected with HIV. Your health care provider may recommend a prescription medicine to help prevent HIV infection.  What else  can I do?  Schedule regular health, dental, and eye exams.  Stay current with your vaccines (immunizations).  Do not use any tobacco products, such as cigarettes, chewing tobacco, and e-cigarettes. If you need help quitting, ask your health care provider.  Limit alcohol intake to no more than 2 drinks per day. One drink equals 12 ounces of beer, 5 ounces of wine, or 1 ounces of hard liquor.  Do not use street drugs.  Do not share needles.  Ask your health care provider for help if you need support or information about quitting drugs.  Tell your health care provider if you often feel depressed.  Tell your health care provider if you have ever been abused or do not feel safe at home. This information is not intended to replace advice given to you by your health care provider. Make sure you discuss any questions you have with your health  care provider. Document Released: 05/17/2008 Document Revised: 07/18/2016 Document Reviewed: 08/23/2015 Elsevier Interactive Patient Education  2018 Elsevier Inc.  

## 2017-05-16 ENCOUNTER — Telehealth: Payer: Self-pay | Admitting: Family Medicine

## 2017-05-16 NOTE — Telephone Encounter (Signed)
[facility]  Insurance prefers one touch  [facility]

## 2017-05-17 MED ORDER — GLUCOSE BLOOD VI STRP: ORAL_STRIP | 3 refills | Status: AC

## 2017-05-17 MED ORDER — ONETOUCH ULTRA MINI W/DEVICE KIT: 1.0000 | PACK | 0 refills | Status: AC

## 2017-05-17 MED ORDER — ONETOUCH ULTRASOFT LANCETS MISC: 3 refills | Status: DC

## 2017-05-17 NOTE — Telephone Encounter (Signed)
onetouch meter[facility]strips sent [facility].

## 2017-06-26 MED ORDER — ONETOUCH DELICA LANCETS 33G MISC: 2 refills | Status: AC

## 2017-06-26 NOTE — Addendum Note (Signed)
Addended by: Patience MuscaISLEY, RENA M on: 06/26/2017 08:55 AM   Modules accepted: Orders

## 2017-06-26 NOTE — Telephone Encounter (Signed)
[facility] called and pt has to use onetouch delica lancets; [facility] changed onetouch ultrasoft lancets to [place] and wanted pts med list updated. Done.

## 2017-06-28 ENCOUNTER — Telehealth: Payer: Self-pay

## 2017-06-28 MED ORDER — LOSARTAN POTASSIUM 50 MG PO TABS: 50.0000 mg | ORAL_TABLET | Freq: Every day | ORAL | 0 refills | Status: DC

## 2017-06-28 NOTE — Telephone Encounter (Signed)
Pt left v/m requesting cb about substitute med for valsartan; pt got notification from pharmacy valsartan had been recalled. [facility]

## 2017-06-28 NOTE — Telephone Encounter (Signed)
plz notify I've sent in losartan 50mg  for patient to take once daily for next few months until recall is ended. If blood pressure drops on med, rec he cut losartan tablets in half and take 25mg  daily. [facility]

## 2017-06-28 NOTE — Telephone Encounter (Signed)
Left detailed message on vm per dpr 

## 2017-07-03 ENCOUNTER — Other Ambulatory Visit: Payer: Self-pay | Admitting: Family Medicine

## 2017-07-11 ENCOUNTER — Other Ambulatory Visit: Payer: Self-pay

## 2017-07-11 MED ORDER — CARVEDILOL 25 MG PO TABS: 25.0000 mg | ORAL_TABLET | Freq: Two times a day (BID) | ORAL | 0 refills | Status: DC

## 2017-08-28 ENCOUNTER — Telehealth: Payer: Self-pay | Admitting: Family Medicine

## 2017-08-28 DIAGNOSIS — E1121 Type 2 diabetes mellitus with diabetic nephropathy: Secondary | ICD-10-CM

## 2017-08-28 DIAGNOSIS — IMO0002 Reserved for concepts with insufficient information to code with codable children: Secondary | ICD-10-CM

## 2017-08-28 DIAGNOSIS — E1165 Type 2 diabetes mellitus with hyperglycemia: Principal | ICD-10-CM

## 2017-08-28 NOTE — Telephone Encounter (Signed)
Yes come in this week if able. Labs ordered.

## 2017-08-28 NOTE — Telephone Encounter (Signed)
Spoke to pt. Made lab appt. 

## 2017-08-28 NOTE — Telephone Encounter (Signed)
Patient is coming up for a 3 month follow up appointment for his diabetes on 09/02/17.  Patient wants to know if Dr.G wants him to have lab work done before the appointment.

## 2017-08-30 ENCOUNTER — Other Ambulatory Visit (INDEPENDENT_AMBULATORY_CARE_PROVIDER_SITE_OTHER): Payer: BC Managed Care – PPO

## 2017-08-30 DIAGNOSIS — IMO0002 Reserved for concepts with insufficient information to code with codable children: Secondary | ICD-10-CM

## 2017-08-30 DIAGNOSIS — E1121 Type 2 diabetes mellitus with diabetic nephropathy: Secondary | ICD-10-CM | POA: Diagnosis not present

## 2017-08-30 DIAGNOSIS — E1165 Type 2 diabetes mellitus with hyperglycemia: Secondary | ICD-10-CM | POA: Diagnosis not present

## 2017-08-30 LAB — BASIC METABOLIC PANEL
BUN: 21 mg/dL (ref 6–23)
CALCIUM: 9.6 mg/dL (ref 8.4–10.5)
CO2: 25 meq/L (ref 19–32)
CREATININE: 1.52 mg/dL — AB (ref 0.40–1.50)
Chloride: 106 mEq/L (ref 96–112)
GFR: 64.58 mL/min (ref >60.00)
GLUCOSE: 138 mg/dL — AB (ref 70–99)
Potassium: 4 mEq/L (ref 3.5–5.1)
SODIUM: 138 meq/L (ref 135–145)

## 2017-08-30 LAB — HEMOGLOBIN A1C: Hgb A1c MFr Bld: 6.2 % (ref 4.6–6.5)

## 2017-09-02 ENCOUNTER — Encounter: Payer: Self-pay | Admitting: Family Medicine

## 2017-09-02 ENCOUNTER — Ambulatory Visit (INDEPENDENT_AMBULATORY_CARE_PROVIDER_SITE_OTHER): Payer: BC Managed Care – PPO | Admitting: Family Medicine

## 2017-09-02 VITALS — BP 122/84 | HR 85 | Temp 98.1°F | Wt 245.0 lb

## 2017-09-02 DIAGNOSIS — N183 Chronic kidney disease, stage 3 unspecified: Secondary | ICD-10-CM

## 2017-09-02 DIAGNOSIS — E1121 Type 2 diabetes mellitus with diabetic nephropathy: Secondary | ICD-10-CM

## 2017-09-02 DIAGNOSIS — I10 Essential (primary) hypertension: Secondary | ICD-10-CM

## 2017-09-02 DIAGNOSIS — Z23 Encounter for immunization: Secondary | ICD-10-CM

## 2017-09-02 MED ORDER — SITAGLIPTIN PHOSPHATE 50 MG PO TABS: 50.0000 mg | ORAL_TABLET | Freq: Every day | ORAL | 3 refills | Status: DC

## 2017-09-02 MED ORDER — LOSARTAN POTASSIUM 50 MG PO TABS: 50.0000 mg | ORAL_TABLET | Freq: Every day | ORAL | 3 refills | Status: DC

## 2017-09-02 MED ORDER — CARVEDILOL 25 MG PO TABS: 25.0000 mg | ORAL_TABLET | Freq: Two times a day (BID) | ORAL | 3 refills | Status: DC

## 2017-09-02 NOTE — Assessment & Plan Note (Signed)
Chronic, stable. Continue current regimen. 

## 2017-09-02 NOTE — Assessment & Plan Note (Signed)
Chronic, stable 

## 2017-09-02 NOTE — Progress Notes (Signed)
BP 122/84 (BP Location: Left Arm, Patient Position: Sitting, Cuff Size: Large)   Pulse 85   Temp 98.1 F (36.7 C) (Oral)   Wt 245 lb (111.1 kg)   SpO2 98%   BMI 31.24 kg/m    CC: 3 mo f/u visit Subjective:    Patient ID: Rodney Ortiz, male    DOB: 12-27-1975, 41 y.o.   MRN: [id number]  HPI: Rodney Ortiz is a 41 y.o. male presenting on 09/02/2017 for 3-4 mo follow-up (Requests refill for Junuvia be sent to [facility])   Valsartan --> losartan due to recall. Continues carvedilol. Blood pressure running well controlled. Does not check at home. No HA, vision changes, CP/tightness, SOB, leg swelling.   DM - does not regularly check sugars (once a week, well controlled). Compliant with antihyperglycemic regimen which includes: januvia 61m daily. Denies low sugars or hypoglycemic symptoms. Denies paresthesias. Last diabetic eye exam 03/2017. Pneumovax: done. Prevnar: not due. Glucometer brand: One-touch with delica lancets. DSME: he did see nutritionist years ago.  Lab Results  Component Value Date   HGBA1C 6.2 08/30/2017   Diabetic Foot Exam - Simple   No data filed     Lab Results  Component Value Date   MICROALBUR 36.5 (H) 06/03/2015     Has made significant healthy diet changes, working on regular exercise regimen (walking, swimming over summer)  Relevant past medical, surgical, family and social history reviewed and updated as indicated. Interim medical history since our last visit reviewed. Allergies and medications reviewed and updated. Outpatient Medications Prior to Visit  Medication Sig Dispense Refill  . Blood Glucose Monitoring Suppl (ONE TOUCH ULTRA MINI) w/Device KIT 1 kit by Does not apply route as directed. 1 each 0  . cholecalciferol (VITAMIN D) 1000 units tablet Take 1,000 Units by mouth daily.    .Marland Kitchenglucose blood (ONE TOUCH ULTRA TEST) test strip Use as instructed to check sugars daily and as needed 100 each 3  . ONETOUCH DELICA LANCETS 381LMISC Check blood  sugar daily and as directed or needed. 100 each 2  . simvastatin (ZOCOR) 20 MG tablet TAKE 1 TABLET AT BEDTIME 90 tablet 3  . carvedilol (COREG) 25 MG tablet Take 1 tablet (25 mg total) by mouth 2 (two) times daily with a meal. 180 tablet 0  . losartan (COZAAR) 50 MG tablet Take 1 tablet (50 mg total) by mouth daily. 90 tablet 0  . sitaGLIPtin (JANUVIA) 50 MG tablet Take 1 tablet (50 mg total) by mouth daily. 30 tablet 4  . valsartan (DIOVAN) 80 MG tablet Take 1 tablet (80 mg total) by mouth daily. 90 tablet 3   No facility-administered medications prior to visit.      Per HPI unless specifically indicated in ROS section below Review of Systems     Objective:    BP 122/84 (BP Location: Left Arm, Patient Position: Sitting, Cuff Size: Large)   Pulse 85   Temp 98.1 F (36.7 C) (Oral)   Wt 245 lb (111.1 kg)   SpO2 98%   BMI 31.24 kg/m   Wt Readings from Last 3 Encounters:  09/02/17 245 lb (111.1 kg)  05/15/17 250 lb (113.4 kg)  05/18/16 251 lb 8 oz (114.1 kg)    Physical Exam  Constitutional: He appears well-developed and well-nourished. No distress.  HENT:  Head: Normocephalic and atraumatic.  Right Ear: External ear normal.  Left Ear: External ear normal.  Nose: Nose normal.  Mouth/Throat: Oropharynx is clear and moist.  No oropharyngeal exudate.  Eyes: Pupils are equal, round, and reactive to light. Conjunctivae and EOM are normal. No scleral icterus.  Neck: Normal range of motion. Neck supple.  Cardiovascular: Normal rate, regular rhythm, normal heart sounds and intact distal pulses.   No murmur heard. Pulmonary/Chest: Effort normal and breath sounds normal. No respiratory distress. He has no wheezes. He has no rales.  Musculoskeletal: He exhibits no edema.  See HPI for foot exam if done  Lymphadenopathy:    He has no cervical adenopathy.  Skin: Skin is warm and dry. No rash noted.  Psychiatric: He has a normal mood and affect.  Nursing note and vitals  reviewed.  Results for orders placed or performed in visit on 08/30/17  Hemoglobin A1c  Result Value Ref Range   Hgb A1c MFr Bld 6.2 4.6 - 6.5 %  Basic metabolic panel  Result Value Ref Range   Sodium 138 135 - 145 mEq/L   Potassium 4.0 3.5 - 5.1 mEq/L   Chloride 106 96 - 112 mEq/L   CO2 25 19 - 32 mEq/L   Glucose, Bld 138 (H) 70 - 99 mg/dL   BUN 21 6 - 23 mg/dL   Creatinine, Ser 1.52 (H) 0.40 - 1.50 mg/dL   Calcium 9.6 8.4 - 10.5 mg/dL   GFR 64.58 >60.00 mL/min   Lab Results  Component Value Date   CHOL 122 05/10/2017   HDL 29.90 (L) 05/10/2017   LDLCALC 71 05/10/2017   LDLDIRECT 65.2 10/12/2011   TRIG 110.0 05/10/2017   CHOLHDL 4 05/10/2017       Assessment & Plan:   Problem List Items Addressed This Visit    Chronic renal insufficiency, stage III (moderate) (HCC)    Chronic, stable.       Controlled diabetes mellitus with diabetic nephropathy, without long-term current use of insulin (HCC) - Primary    Chronic, stable. Update labs. Continue [place]. He desires to continue this regimen.       Relevant Medications   sitaGLIPtin (JANUVIA) 50 MG tablet   losartan (COZAAR) 50 MG tablet   Essential hypertension    Chronic, stable. Continue current regimen.       Relevant Medications   losartan (COZAAR) 50 MG tablet   carvedilol (COREG) 25 MG tablet       Follow up plan: Return in about 6 months (around 03/03/2018) for follow up visit.  Ria Bush, MD

## 2017-09-02 NOTE — Patient Instructions (Addendum)
Flu shot today.  Sugars are doing much better!  Continue healthy diet and lifestyle changes as up to now.  Return as needed or in 6 months for diabetes follow up visit.

## 2017-09-02 NOTE — Assessment & Plan Note (Signed)
Chronic, stable. Update labs. Continue [place]. He desires to continue this regimen.

## 2017-09-02 NOTE — Addendum Note (Signed)
Addended by: Nanci Pina on: 09/02/2017 12:32 PM   Modules accepted: Orders

## 2017-12-09 ENCOUNTER — Encounter: Payer: Self-pay | Admitting: Family Medicine

## 2018-03-03 ENCOUNTER — Other Ambulatory Visit (INDEPENDENT_AMBULATORY_CARE_PROVIDER_SITE_OTHER): Payer: BC Managed Care – PPO

## 2018-03-03 ENCOUNTER — Ambulatory Visit: Payer: BC Managed Care – PPO | Admitting: Family Medicine

## 2018-03-03 ENCOUNTER — Other Ambulatory Visit: Payer: Self-pay | Admitting: Family Medicine

## 2018-03-03 DIAGNOSIS — N183 Chronic kidney disease, stage 3 unspecified: Secondary | ICD-10-CM

## 2018-03-03 DIAGNOSIS — E1121 Type 2 diabetes mellitus with diabetic nephropathy: Secondary | ICD-10-CM | POA: Diagnosis not present

## 2018-03-03 DIAGNOSIS — E785 Hyperlipidemia, unspecified: Secondary | ICD-10-CM

## 2018-03-03 LAB — RENAL FUNCTION PANEL
ALBUMIN: 3.8 g/dL (ref 3.5–5.2)
BUN: 20 mg/dL (ref 6–23)
CALCIUM: 9.4 mg/dL (ref 8.4–10.5)
CO2: 27 meq/L (ref 19–32)
CREATININE: 1.72 mg/dL — AB (ref 0.40–1.50)
Chloride: 106 mEq/L (ref 96–112)
GFR: 55.86 mL/min — ABNORMAL LOW (ref >60.00)
GLUCOSE: 163 mg/dL — AB (ref 70–99)
Phosphorus: 3.7 mg/dL (ref 2.3–4.6)
Potassium: 4.1 mEq/L (ref 3.5–5.1)
Sodium: 140 mEq/L (ref 135–145)

## 2018-03-03 LAB — HM DIABETES EYE EXAM

## 2018-03-03 LAB — HEMOGLOBIN A1C: Hgb A1c MFr Bld: 6.8 % — ABNORMAL HIGH (ref 4.6–6.5)

## 2018-03-07 ENCOUNTER — Encounter: Payer: Self-pay | Admitting: Family Medicine

## 2018-03-07 ENCOUNTER — Ambulatory Visit: Payer: BC Managed Care – PPO | Admitting: Family Medicine

## 2018-03-07 VITALS — BP 126/82 | HR 90 | Temp 98.2°F | Wt 248.0 lb

## 2018-03-07 DIAGNOSIS — I10 Essential (primary) hypertension: Secondary | ICD-10-CM

## 2018-03-07 DIAGNOSIS — N183 Chronic kidney disease, stage 3 unspecified: Secondary | ICD-10-CM

## 2018-03-07 DIAGNOSIS — E1121 Type 2 diabetes mellitus with diabetic nephropathy: Secondary | ICD-10-CM | POA: Diagnosis not present

## 2018-03-07 MED ORDER — FLUTICASONE PROPIONATE 50 MCG/ACT NA SUSP
1.0000 | Freq: Every day | NASAL | 3 refills | Status: DC
Start: 2018-03-07 — End: 2021-01-19

## 2018-03-07 NOTE — Assessment & Plan Note (Signed)
Chronic, A1c elevated but still controlled. Continue current regimen.

## 2018-03-07 NOTE — Assessment & Plan Note (Signed)
Chronic, stable. Continue current regimen. 

## 2018-03-07 NOTE — Assessment & Plan Note (Signed)
Chronic, GFR slightly lower today. Encouraged good hydration. Already avoids NSAIDs and other nephrotoxins.

## 2018-03-07 NOTE — Progress Notes (Signed)
BP 126/82 (BP Location: Left Arm, Patient Position: Sitting, Cuff Size: Large)   Pulse 90   Temp 98.2 F (36.8 C) (Oral)   Wt 248 lb (112.5 kg)   SpO2 98%   BMI 31.63 kg/m    CC: 6 mo f/u visit Subjective:    Patient ID: Rodney Ortiz, male    DOB: [birth year], 42 y.o.   MRN: [id number]  HPI: Rodney Ortiz is a 42 y.o. male presenting on 03/07/2018 for 6 mo follow-up   DM - stopped checking sugars regularly check sugars. Compliant with antihyperglycemic regimen which includes: [place] '50mg'$  daily. Denies low sugars or hypoglycemic symptoms. Denies paresthesias. Last diabetic eye exam 03/2017. Pneumovax: 2015. Prevnar: not due. Glucometer brand: one touch with delica lancets. DSME: has seen nutritionist previously. Lab Results  Component Value Date   HGBA1C 6.8 (H) 03/03/2018   Diabetic Foot Exam - Simple   Simple Foot Form Diabetic Foot exam was performed with the following findings:  Yes 03/07/2018  8:28 AM  Visual Inspection No deformities, no ulcerations, no other skin breakdown bilaterally:  Yes Sensation Testing Intact to touch and monofilament testing bilaterally:  Yes Pulse Check Posterior Tibialis and Dorsalis pulse intact bilaterally:  Yes Comments    Lab Results  Component Value Date   MICROALBUR 36.5 (H) 06/03/2015     Recent [facility] treated with flonase with improvement. Requests flonase refill.   Relevant past medical, surgical, family and social history reviewed and updated as indicated. Interim medical history since our last visit reviewed. Allergies and medications reviewed and updated. Outpatient Medications Prior to Visit  Medication Sig Dispense Refill  . Blood Glucose Monitoring Suppl (ONE TOUCH ULTRA MINI) w/Device KIT 1 kit by Does not apply route as directed. 1 each 0  . carvedilol (COREG) 25 MG tablet Take 1 tablet (25 mg total) by mouth 2 (two) times daily with a meal. 180 tablet 3  . cholecalciferol (VITAMIN D) 1000 units tablet Take  1,000 Units by mouth daily.    Marland Kitchen glucose blood (ONE TOUCH ULTRA TEST) test strip Use as instructed to check sugars daily and as needed 100 each 3  . losartan (COZAAR) 50 MG tablet Take 1 tablet (50 mg total) by mouth daily. 90 tablet 3  . ONETOUCH DELICA LANCETS 40J MISC Check blood sugar daily and as directed or needed. 100 each 2  . simvastatin (ZOCOR) 20 MG tablet TAKE 1 TABLET AT BEDTIME 90 tablet 3  . sitaGLIPtin (JANUVIA) 50 MG tablet Take 1 tablet (50 mg total) by mouth daily. 90 tablet 3  . fluticasone (FLONASE) 50 MCG/ACT nasal spray Place 1 spray into both nostrils daily.     No facility-administered medications prior to visit.      Per HPI unless specifically indicated in ROS section below Review of Systems     Objective:    BP 126/82 (BP Location: Left Arm, Patient Position: Sitting, Cuff Size: Large)   Pulse 90   Temp 98.2 F (36.8 C) (Oral)   Wt 248 lb (112.5 kg)   SpO2 98%   BMI 31.63 kg/m   Wt Readings from Last 3 Encounters:  03/07/18 248 lb (112.5 kg)  09/02/17 245 lb (111.1 kg)  05/15/17 250 lb (113.4 kg)    Physical Exam  Constitutional: He appears well-developed and well-nourished. No distress.  HENT:  Head: Normocephalic and atraumatic.  Right Ear: External ear normal.  Left Ear: External ear normal.  Nose: Nose normal.  Mouth/Throat: Oropharynx  is clear and moist. No oropharyngeal exudate.  Eyes: Pupils are equal, round, and reactive to light. Conjunctivae and EOM are normal. No scleral icterus.  Neck: Normal range of motion. Neck supple.  Cardiovascular: Normal rate, regular rhythm, normal heart sounds and intact distal pulses.  No murmur heard. Pulmonary/Chest: Effort normal and breath sounds normal. No respiratory distress. He has no wheezes. He has no rales.  Musculoskeletal: He exhibits no edema.  See HPI for foot exam if done  Lymphadenopathy:    He has no cervical adenopathy.  Skin: Skin is warm and dry. No rash noted.  Psychiatric: He has  a normal mood and affect.  Nursing note and vitals reviewed.  Results for orders placed or performed in visit on 03/03/18  Renal function panel  Result Value Ref Range   Sodium 140 135 - 145 mEq/L   Potassium 4.1 3.5 - 5.1 mEq/L   Chloride 106 96 - 112 mEq/L   CO2 27 19 - 32 mEq/L   Calcium 9.4 8.4 - 10.5 mg/dL   Albumin 3.8 3.5 - 5.2 g/dL   BUN 20 6 - 23 mg/dL   Creatinine, Ser 1.72 (H) 0.40 - 1.50 mg/dL   Glucose, Bld 163 (H) 70 - 99 mg/dL   Phosphorus 3.7 2.3 - 4.6 mg/dL   GFR 55.86 (L) >60.00 mL/min  Hemoglobin A1c  Result Value Ref Range   Hgb A1c MFr Bld 6.8 (H) 4.6 - 6.5 %   Lab Results  Component Value Date   CHOL 122 05/10/2017   HDL 29.90 (L) 05/10/2017   LDLCALC 71 05/10/2017   LDLDIRECT 65.2 10/12/2011   TRIG 110.0 05/10/2017   CHOLHDL 4 05/10/2017       Assessment & Plan:   Problem List Items Addressed This Visit    Chronic renal insufficiency, stage III (moderate) (HCC)    Chronic, GFR slightly lower today. Encouraged good hydration. Already avoids NSAIDs and other nephrotoxins.       Controlled diabetes mellitus with diabetic nephropathy, without long-term current use of insulin (HCC) - Primary    Chronic, A1c elevated but still controlled. Continue current regimen.       Essential hypertension    Chronic, stable. Continue current regimen.           Meds ordered this encounter  Medications  . fluticasone (FLONASE) 50 MCG/ACT nasal spray    Sig: Place 1 spray into both nostrils daily.    Dispense:  16 g    Refill:  3   No orders of the defined types were placed in this encounter.   Follow up plan: Return in about 3 months (around 06/06/2018) for annual exam, prior fasting for blood work.  Ria Bush, MD  6 mo f/u visit

## 2018-03-07 NOTE — Patient Instructions (Addendum)
Labs were ok - make sure to stay well hydrated.  Continue current medicines. Return this summer for physical - 3-4 months.

## 2018-06-17 ENCOUNTER — Other Ambulatory Visit: Payer: Self-pay | Admitting: Family Medicine

## 2018-06-17 ENCOUNTER — Other Ambulatory Visit (INDEPENDENT_AMBULATORY_CARE_PROVIDER_SITE_OTHER): Payer: BC Managed Care – PPO

## 2018-06-17 DIAGNOSIS — N183 Chronic kidney disease, stage 3 unspecified: Secondary | ICD-10-CM

## 2018-06-17 DIAGNOSIS — E1121 Type 2 diabetes mellitus with diabetic nephropathy: Secondary | ICD-10-CM

## 2018-06-17 DIAGNOSIS — E785 Hyperlipidemia, unspecified: Secondary | ICD-10-CM

## 2018-06-17 LAB — COMPREHENSIVE METABOLIC PANEL
ALT: 25 U/L (ref 0–53)
AST: 22 U/L (ref 0–37)
Albumin: 4 g/dL (ref 3.5–5.2)
Alkaline Phosphatase: 54 U/L (ref 39–117)
BILIRUBIN TOTAL: 0.8 mg/dL (ref 0.2–1.2)
BUN: 20 mg/dL (ref 6–23)
CHLORIDE: 107 meq/L (ref 96–112)
CO2: 27 meq/L (ref 19–32)
CREATININE: 1.73 mg/dL — AB (ref 0.40–1.50)
Calcium: 9.4 mg/dL (ref 8.4–10.5)
GFR: 55.41 mL/min — ABNORMAL LOW (ref >60.00)
Glucose, Bld: 157 mg/dL — ABNORMAL HIGH (ref 70–99)
Potassium: 4.1 mEq/L (ref 3.5–5.1)
SODIUM: 140 meq/L (ref 135–145)
Total Protein: 7.4 g/dL (ref 6.0–8.3)

## 2018-06-17 LAB — CBC WITH DIFFERENTIAL/PLATELET
Basophils Absolute: 0 10*3/uL (ref 0.0–0.1)
Basophils Relative: 0.6 % (ref 0.0–3.0)
EOS PCT: 6 % — AB (ref 0.0–5.0)
Eosinophils Absolute: 0.3 10*3/uL (ref 0.0–0.7)
HCT: 42.3 % (ref 39.0–52.0)
Hemoglobin: 14.3 g/dL (ref 13.0–17.0)
LYMPHS ABS: 1.5 10*3/uL (ref 0.7–4.0)
LYMPHS PCT: 29.9 % (ref 12.0–46.0)
MCHC: 33.8 g/dL (ref 30.0–36.0)
MCV: 81.7 fl (ref 78.0–100.0)
MONOS PCT: 8.5 % (ref 3.0–12.0)
Monocytes Absolute: 0.4 10*3/uL (ref 0.1–1.0)
NEUTROS ABS: 2.8 10*3/uL (ref 1.4–7.7)
NEUTROS PCT: 55 % (ref 43.0–77.0)
PLATELETS: 158 10*3/uL (ref 150.0–400.0)
RBC: 5.17 Mil/uL (ref 4.22–5.81)
RDW: 13.5 % (ref 11.5–15.5)
WBC: 5.2 10*3/uL (ref 4.0–10.5)

## 2018-06-17 LAB — LIPID PANEL
CHOL/HDL RATIO: 4
Cholesterol: 143 mg/dL (ref 0–200)
HDL: 37 mg/dL — ABNORMAL LOW (ref >39.00)
LDL CALC: 79 mg/dL (ref 0–99)
NONHDL: 105.74
Triglycerides: 132 mg/dL (ref 0.0–149.0)
VLDL: 26.4 mg/dL (ref 0.0–40.0)

## 2018-06-17 LAB — VITAMIN D 25 HYDROXY (VIT D DEFICIENCY, FRACTURES): VITD: 27.71 ng/mL — ABNORMAL LOW (ref 30.00–100.00)

## 2018-06-17 LAB — HEMOGLOBIN A1C: Hgb A1c MFr Bld: 6.6 % — ABNORMAL HIGH (ref 4.6–6.5)

## 2018-06-20 ENCOUNTER — Encounter: Payer: Self-pay | Admitting: Family Medicine

## 2018-06-20 ENCOUNTER — Ambulatory Visit (INDEPENDENT_AMBULATORY_CARE_PROVIDER_SITE_OTHER): Payer: BC Managed Care – PPO | Admitting: Family Medicine

## 2018-06-20 VITALS — BP 130/86 | HR 67 | Temp 98.6°F | Ht 74.5 in | Wt 249.8 lb

## 2018-06-20 DIAGNOSIS — E785 Hyperlipidemia, unspecified: Secondary | ICD-10-CM

## 2018-06-20 DIAGNOSIS — N183 Chronic kidney disease, stage 3 unspecified: Secondary | ICD-10-CM

## 2018-06-20 DIAGNOSIS — E669 Obesity, unspecified: Secondary | ICD-10-CM | POA: Diagnosis not present

## 2018-06-20 DIAGNOSIS — I10 Essential (primary) hypertension: Secondary | ICD-10-CM

## 2018-06-20 DIAGNOSIS — Z0000 Encounter for general adult medical examination without abnormal findings: Secondary | ICD-10-CM

## 2018-06-20 DIAGNOSIS — E1121 Type 2 diabetes mellitus with diabetic nephropathy: Secondary | ICD-10-CM

## 2018-06-20 LAB — MICROALBUMIN / CREATININE URINE RATIO
Creatinine,U: 220.7 mg/dL
MICROALB/CREAT RATIO: 53.3 mg/g — AB (ref 0.0–30.0)
Microalb, Ur: 117.6 mg/dL — ABNORMAL HIGH (ref 0.0–1.9)

## 2018-06-20 MED ORDER — CARVEDILOL 25 MG PO TABS: 25.0000 mg | ORAL_TABLET | Freq: Two times a day (BID) | ORAL | 3 refills | Status: DC

## 2018-06-20 MED ORDER — LOSARTAN POTASSIUM 50 MG PO TABS: 50.0000 mg | ORAL_TABLET | Freq: Every day | ORAL | 3 refills | Status: DC

## 2018-06-20 MED ORDER — SITAGLIPTIN PHOSPHATE 50 MG PO TABS: 50.0000 mg | ORAL_TABLET | Freq: Every day | ORAL | 3 refills | Status: DC

## 2018-06-20 MED ORDER — SIMVASTATIN 20 MG PO TABS: 20.0000 mg | ORAL_TABLET | Freq: Every day | ORAL | 3 refills | Status: DC

## 2018-06-20 NOTE — Assessment & Plan Note (Signed)
Chronic, stable. Continue [place]uvia (pricey) - I did ask him to price out onglyza or tradjenta.

## 2018-06-20 NOTE — Assessment & Plan Note (Signed)
Chronic, stable. Continue to avoid nephrotoxins. Discussed hydration status.

## 2018-06-20 NOTE — Assessment & Plan Note (Signed)
Encouraged ongoing healthy diet and lifestyle changes to affect sustainable weight loss.  

## 2018-06-20 NOTE — Assessment & Plan Note (Signed)
Preventative protocols reviewed and updated unless pt declined. Discussed healthy diet and lifestyle.  

## 2018-06-20 NOTE — Progress Notes (Signed)
BP 130/86 (BP Location: Right Arm, Patient Position: Sitting, Cuff Size: Large)   Pulse 67   Temp 98.6 F (37 C) (Oral)   Ht 6' 2.5" (1.892 m)   Wt 249 lb 12 oz (113.3 kg)   SpO2 99%   BMI 31.64 kg/m    CC: CPE Subjective:    Patient ID: Rodney Ortiz, male    DOB: [birth year], 42 y.o.   MRN: [id number]  HPI: Rodney Ortiz is a 42 y.o. male presenting on 06/20/2018 for Annual Exam   Preventative: Flu shot yearly Tdap 2015 Pneumovax 2015 Seat belt use discussed Sunscreen use discussed. No changing moles on skin.  Non smoker Alcohol - none  Dentist - hasn't seen recently Eye exam - last seen 1 month ago - sees yearly. Started using bifocals.   Lives with wife Larina Bras) Occ: Regulatory affairs officer in [employer]  Activity: walking, enjoys hiking in [place]  Diet: good water, fruits/vegetables daily   Relevant past medical, surgical, family and social history reviewed and updated as indicated. Interim medical history since our last visit reviewed. Allergies and medications reviewed and updated. Outpatient Medications Prior to Visit  Medication Sig Dispense Refill  . Blood Glucose Monitoring Suppl (ONE TOUCH ULTRA MINI) w/Device KIT 1 kit by Does not apply route as directed. 1 each 0  . cholecalciferol (VITAMIN D) 1000 units tablet Take 1,000 Units by mouth daily.    . fluticasone (FLONASE) 50 MCG/ACT nasal spray Place 1 spray into both nostrils daily. 16 g 3  . glucose blood (ONE TOUCH ULTRA TEST) test strip Use as instructed to check sugars daily and as needed 100 each 3  . ONETOUCH DELICA LANCETS 90Z MISC Check blood sugar daily and as directed or needed. 100 each 2  . carvedilol (COREG) 25 MG tablet Take 1 tablet (25 mg total) by mouth 2 (two) times daily with a meal. 180 tablet 3  . losartan (COZAAR) 50 MG tablet Take 1 tablet (50 mg total) by mouth daily. 90 tablet 3  . simvastatin (ZOCOR) 20 MG tablet TAKE 1 TABLET AT BEDTIME 90 tablet 3  . sitaGLIPtin (JANUVIA) 50 MG tablet  Take 1 tablet (50 mg total) by mouth daily. 90 tablet 3   No facility-administered medications prior to visit.      Per HPI unless specifically indicated in ROS section below Review of Systems  Constitutional: Negative for activity change, appetite change, chills, fatigue, fever and unexpected weight change.  HENT: Negative for hearing loss.   Eyes: Negative for visual disturbance.  Respiratory: Negative for cough, chest tightness, shortness of breath and wheezing.   Cardiovascular: Negative for chest pain, palpitations and leg swelling.  Gastrointestinal: Negative for abdominal distention, abdominal pain, blood in stool, constipation, diarrhea, nausea and vomiting.  Genitourinary: Negative for difficulty urinating and hematuria.  Musculoskeletal: Negative for arthralgias, myalgias and neck pain.  Skin: Negative for rash.  Neurological: Negative for dizziness, seizures, syncope and headaches.  Hematological: Negative for adenopathy. Does not bruise/bleed easily.  Psychiatric/Behavioral: Negative for dysphoric mood. The patient is not nervous/anxious.        Objective:    BP 130/86 (BP Location: Right Arm, Patient Position: Sitting, Cuff Size: Large)   Pulse 67   Temp 98.6 F (37 C) (Oral)   Ht 6' 2.5" (1.892 m)   Wt 249 lb 12 oz (113.3 kg)   SpO2 99%   BMI 31.64 kg/m   Wt Readings from Last 3 Encounters:  06/20/18 249 lb 12 oz (113.3  kg)  03/07/18 248 lb (112.5 kg)  09/02/17 245 lb (111.1 kg)    Physical Exam  Constitutional: He is oriented to person, place, and time. He appears well-developed and well-nourished. No distress.  HENT:  Head: Normocephalic and atraumatic.  Right Ear: Hearing, tympanic membrane, external ear and ear canal normal.  Left Ear: Hearing, tympanic membrane, external ear and ear canal normal.  Nose: Nose normal.  Mouth/Throat: Uvula is midline, oropharynx is clear and moist and mucous membranes are normal. No oropharyngeal exudate, posterior  oropharyngeal edema or posterior oropharyngeal erythema.  Eyes: Pupils are equal, round, and reactive to light. Conjunctivae and EOM are normal. No scleral icterus.  Neck: Normal range of motion. Neck supple. No thyromegaly present.  Cardiovascular: Normal rate, regular rhythm, normal heart sounds and intact distal pulses.  No murmur heard. Pulses:      Radial pulses are 2+ on the right side, and 2+ on the left side.  Pulmonary/Chest: Effort normal and breath sounds normal. No respiratory distress. He has no wheezes. He has no rales.  Abdominal: Soft. Bowel sounds are normal. He exhibits no distension and no mass. There is no tenderness. There is no rebound and no guarding.  Musculoskeletal: Normal range of motion. He exhibits no edema.  Lymphadenopathy:    He has no cervical adenopathy.  Neurological: He is alert and oriented to person, place, and time.  CN grossly intact, station and gait intact  Skin: Skin is warm and dry. No rash noted.  Psychiatric: He has a normal mood and affect. His behavior is normal. Judgment and thought content normal.  Nursing note and vitals reviewed.  Results for orders placed or performed in visit on 06/20/18  HM DIABETES EYE EXAM  Result Value Ref Range   HM Diabetic Eye Exam No Retinopathy No Retinopathy      Assessment & Plan:   Problem List Items Addressed This Visit    Preventative health care - Primary    Preventative protocols reviewed and updated unless pt declined. Discussed healthy diet and lifestyle.       Obesity, Class I, BMI 30-34.9    Encouraged ongoing healthy diet and lifestyle changes to affect sustainable weight loss.       HLD (hyperlipidemia)    Chronic, stable. Continue current regimen.  The 10-year ASCVD risk score Mikey Bussing DC Brooke Bonito., et al., 2013) is: 12.2%   Values used to calculate the score:     Age: 18 years     Sex: Male     Is Non-Hispanic African American: Yes     Diabetic: Yes     Tobacco smoker: No     Systolic  Blood Pressure: 130 mmHg     Is BP treated: Yes     HDL Cholesterol: 37 mg/dL     Total Cholesterol: 143 mg/dL       Relevant Medications   losartan (COZAAR) 50 MG tablet   simvastatin (ZOCOR) 20 MG tablet   carvedilol (COREG) 25 MG tablet   Essential hypertension    Chronic, stable. Pt remains concerned about losartan recall. Offered changing to different brand ARB - he will consider this.       Relevant Medications   losartan (COZAAR) 50 MG tablet   simvastatin (ZOCOR) 20 MG tablet   carvedilol (COREG) 25 MG tablet   Controlled diabetes mellitus with diabetic nephropathy, without long-term current use of insulin (HCC)    Chronic, stable. Continue [place] (pricey) - I did ask him to price out onglyza  or tradjenta.       Relevant Medications   losartan (COZAAR) 50 MG tablet   simvastatin (ZOCOR) 20 MG tablet   sitaGLIPtin (JANUVIA) 50 MG tablet   Other Relevant Orders   Microalbumin / creatinine urine ratio   Chronic renal insufficiency, stage III (moderate) (HCC)    Chronic, stable. Continue to avoid nephrotoxins. Discussed hydration status.          Meds ordered this encounter  Medications  . losartan (COZAAR) 50 MG tablet    Sig: Take 1 tablet (50 mg total) by mouth daily.    Dispense:  90 tablet    Refill:  3  . simvastatin (ZOCOR) 20 MG tablet    Sig: Take 1 tablet (20 mg total) by mouth at bedtime.    Dispense:  90 tablet    Refill:  3  . sitaGLIPtin (JANUVIA) 50 MG tablet    Sig: Take 1 tablet (50 mg total) by mouth daily.    Dispense:  90 tablet    Refill:  3  . carvedilol (COREG) 25 MG tablet    Sig: Take 1 tablet (25 mg total) by mouth 2 (two) times daily with a meal.    Dispense:  180 tablet    Refill:  3   Orders Placed This Encounter  Procedures  . Microalbumin / creatinine urine ratio  . HM DIABETES EYE EXAM    This external order was created through the Results Console.    Follow up plan: Return in about 6 months (around 12/21/2018) for  follow up visit.  Ria Bush, MD

## 2018-06-20 NOTE — Assessment & Plan Note (Signed)
Chronic, stable. Pt remains concerned about losartan recall. Offered changing to different brand ARB - he will consider this.

## 2018-06-20 NOTE — Assessment & Plan Note (Signed)
Chronic, stable. Continue current regimen.  The 10-year ASCVD risk score Denman George(Goff DC Montez HagemanJr., et al., 2013) is: 12.2%   Values used to calculate the score:     Age: [age] years     Sex: Male     Is Non-Hispanic African American: Yes     Diabetic: Yes     Tobacco smoker: No     Systolic Blood Pressure: 130 mmHg     Is BP treated: Yes     HDL Cholesterol: 37 mg/dL     Total Cholesterol: 143 mg/dL

## 2018-06-20 NOTE — Patient Instructions (Addendum)
Check on cost of onglyza or tradjenta in place of januvia.  There are alternatives to losartan (same family).  You are doing well today. Return as needed or in 6 months for follow up visit.   Health Maintenance, Male A healthy lifestyle and preventive care is important for your health and wellness. Ask your health care provider about what schedule of regular examinations is right for you. What should I know about weight and diet? Eat a Healthy Diet  Eat plenty of vegetables, fruits, whole grains, low-fat dairy products, and lean protein.  Do not eat a lot of foods high in solid fats, added sugars, or salt.  Maintain a Healthy Weight Regular exercise can help you achieve or maintain a healthy weight. You should:  Do at least 150 minutes of exercise each week. The exercise should increase your heart rate and make you sweat (moderate-intensity exercise).  Do strength-training exercises at least twice a week.  Watch Your Levels of Cholesterol and Blood Lipids  Have your blood tested for lipids and cholesterol every 5 years starting at 44 years of age. If you are at high risk for heart disease, you should start having your blood tested when you are 44 years old. You may need to have your cholesterol levels checked more often if: ? Your lipid or cholesterol levels are high. ? You are older than 44 years of age. ? You are at high risk for heart disease.  What should I know about cancer screening? Many types of cancers can be detected early and may often be prevented. Lung Cancer  You should be screened every year for lung cancer if: ? You are a current smoker who has smoked for at least 30 years. ? You are a former smoker who has quit within the past 15 years.  Talk to your health care provider about your screening options, when you should start screening, and how often you should be screened.  Colorectal Cancer  Routine colorectal cancer screening usually begins at 44 years of age  and should be repeated every 5-10 years until you are 44 years old. You may need to be screened more often if early forms of precancerous polyps or small growths are found. Your health care provider may recommend screening at an earlier age if you have risk factors for colon cancer.  Your health care provider may recommend using home test kits to check for hidden blood in the stool.  A small camera at the end of a tube can be used to examine your colon (sigmoidoscopy or colonoscopy). This checks for the earliest forms of colorectal cancer.  Prostate and Testicular Cancer  Depending on your age and overall health, your health care provider may do certain tests to screen for prostate and testicular cancer.  Talk to your health care provider about any symptoms or concerns you have about testicular or prostate cancer.  Skin Cancer  Check your skin from head to toe regularly.  Tell your health care provider about any new moles or changes in moles, especially if: ? There is a change in a mole's size, shape, or color. ? You have a mole that is larger than a pencil eraser.  Always use sunscreen. Apply sunscreen liberally and repeat throughout the day.  Protect yourself by wearing long sleeves, pants, a wide-brimmed hat, and sunglasses when outside.  What should I know about heart disease, diabetes, and high blood pressure?  If you are 6618-44 years of age, have your blood pressure checked  every 3-5 years. If you are 32 years of age or older, have your blood pressure checked every year. You should have your blood pressure measured twice-once when you are at a hospital or clinic, and once when you are not at a hospital or clinic. Record the average of the two measurements. To check your blood pressure when you are not at a hospital or clinic, you can use: ? An automated blood pressure machine at a pharmacy. ? A home blood pressure monitor.  Talk to your health care provider about your target blood  pressure.  If you are between 67-33 years old, ask your health care provider if you should take aspirin to prevent heart disease.  Have regular diabetes screenings by checking your fasting blood sugar level. ? If you are at a normal weight and have a low risk for diabetes, have this test once every three years after the age of 79. ? If you are overweight and have a high risk for diabetes, consider being tested at a younger age or more often.  A one-time screening for abdominal aortic aneurysm (AAA) by ultrasound is recommended for men aged 78-75 years who are current or former smokers. What should I know about preventing infection? Hepatitis B If you have a higher risk for hepatitis B, you should be screened for this virus. Talk with your health care provider to find out if you are at risk for hepatitis B infection. Hepatitis C Blood testing is recommended for:  Everyone born from [birth year] through 1967.  Anyone with known risk factors for hepatitis C.  Sexually Transmitted Diseases (STDs)  You should be screened each year for STDs including gonorrhea and chlamydia if: ? You are sexually active and are younger than 44 years of age. ? You are older than 44 years of age and your health care provider tells you that you are at risk for this type of infection. ? Your sexual activity has changed since you were last screened and you are at an increased risk for chlamydia or gonorrhea. Ask your health care provider if you are at risk.  Talk with your health care provider about whether you are at high risk of being infected with HIV. Your health care provider may recommend a prescription medicine to help prevent HIV infection.  What else can I do?  Schedule regular health, dental, and eye exams.  Stay current with your vaccines (immunizations).  Do not use any tobacco products, such as cigarettes, chewing tobacco, and e-cigarettes. If you need help quitting, ask your health care  provider.  Limit alcohol intake to no more than 2 drinks per day. One drink equals 12 ounces of beer, 5 ounces of wine, or 1 ounces of hard liquor.  Do not use street drugs.  Do not share needles.  Ask your health care provider for help if you need support or information about quitting drugs.  Tell your health care provider if you often feel depressed.  Tell your health care provider if you have ever been abused or do not feel safe at home. This information is not intended to replace advice given to you by your health care provider. Make sure you discuss any questions you have with your health care provider. Document Released: 05/17/2008 Document Revised: 07/18/2016 Document Reviewed: 08/23/2015 Elsevier Interactive Patient Education  [employer].

## 2018-06-26 ENCOUNTER — Encounter: Payer: Self-pay | Admitting: Family Medicine

## 2018-06-29 MED ORDER — VALSARTAN 80 MG PO TABS: 80.0000 mg | ORAL_TABLET | Freq: Every day | ORAL | 1 refills | Status: DC

## 2018-09-03 ENCOUNTER — Ambulatory Visit: Payer: BC Managed Care – PPO | Admitting: Family Medicine

## 2018-09-03 ENCOUNTER — Encounter: Payer: Self-pay | Admitting: Family Medicine

## 2018-09-03 VITALS — BP 136/86 | HR 90 | Temp 98.3°F | Ht 74.5 in | Wt 253.5 lb

## 2018-09-03 DIAGNOSIS — I10 Essential (primary) hypertension: Secondary | ICD-10-CM | POA: Diagnosis not present

## 2018-09-03 DIAGNOSIS — Z23 Encounter for immunization: Secondary | ICD-10-CM

## 2018-09-03 MED ORDER — VALSARTAN 80 MG PO TABS: 80.0000 mg | ORAL_TABLET | Freq: Every day | ORAL | 3 refills | Status: DC

## 2018-09-03 NOTE — Patient Instructions (Addendum)
Flu shot today. You are doing well today - valsartan will be sent to mail order.  Start monitoring blood pressures at home - let me know if running high (>140/90).  Good to see you today, call us with any questions. Return in 4 months for diabetes check.

## 2018-09-03 NOTE — Assessment & Plan Note (Addendum)
Tolerating valsartan 80mg  well, BP stable to mildly elevated in office today.  Continue this medication - will send to mail order.  I have asked him to start monitoring BP at home and will let me know if consisently high 130s or 140s systolic - would then increase valsartan to 160mg  daily.  Pt agrees with plan.

## 2018-09-03 NOTE — Progress Notes (Signed)
 BP 136/86 (BP Location: Left Arm, Patient Position: Sitting, Cuff Size: Large)   Pulse 90   Temp 98.3 F (36.8 C) (Oral)   Ht 6' 2.5" (1.892 m)   Wt 253 lb 8 oz (115 kg)   SpO2 99%   BMI 32.11 kg/m   On repeat, 146/82  CC: discuss meds Subjective:    Patient ID: Rodney Ortiz, male    DOB: 07/30/1976, 42 y.o.   MRN: [id number]  HPI: Rodney Ortiz is a 42 y.o. male presenting on 09/03/2018 for Medication Follow up (Here for valsartan f/u.)   HTN - compliant with valsartan 80mg daily - this was changed due to worry about recent losartan recall over the past year. Continues carvedilol 25mg bid. No HA, vision changes, CP/tightness, SOB, leg swelling.   Lab Results  Component Value Date   HGBA1C 6.6 (H) 06/17/2018     Relevant past medical, surgical, family and social history reviewed and updated as indicated. Interim medical history since our last visit reviewed. Allergies and medications reviewed and updated. Outpatient Medications Prior to Visit  Medication Sig Dispense Refill  . Blood Glucose Monitoring Suppl (ONE TOUCH ULTRA MINI) w/Device KIT 1 kit by Does not apply route as directed. 1 each 0  . carvedilol (COREG) 25 MG tablet Take 1 tablet (25 mg total) by mouth 2 (two) times daily with a meal. 180 tablet 3  . cholecalciferol (VITAMIN D) 1000 units tablet Take 1,000 Units by mouth daily.    . fluticasone (FLONASE) 50 MCG/ACT nasal spray Place 1 spray into both nostrils daily. 16 g 3  . glucose blood (ONE TOUCH ULTRA TEST) test strip Use as instructed to check sugars daily and as needed 100 each 3  . ONETOUCH DELICA LANCETS 33G MISC Check blood sugar daily and as directed or needed. 100 each 2  . simvastatin (ZOCOR) 20 MG tablet Take 1 tablet (20 mg total) by mouth at bedtime. 90 tablet 3  . sitaGLIPtin (JANUVIA) 50 MG tablet Take 1 tablet (50 mg total) by mouth daily. 90 tablet 3  . valsartan (DIOVAN) 80 MG tablet Take 1 tablet (80 mg total) by mouth daily. 30 tablet 1    No facility-administered medications prior to visit.      Per HPI unless specifically indicated in ROS section below Review of Systems     Objective:    BP 136/86 (BP Location: Left Arm, Patient Position: Sitting, Cuff Size: Large)   Pulse 90   Temp 98.3 F (36.8 C) (Oral)   Ht 6' 2.5" (1.892 m)   Wt 253 lb 8 oz (115 kg)   SpO2 99%   BMI 32.11 kg/m   Wt Readings from Last 3 Encounters:  09/03/18 253 lb 8 oz (115 kg)  06/20/18 249 lb 12 oz (113.3 kg)  03/07/18 248 lb (112.5 kg)    Physical Exam  Constitutional: He appears well-developed and well-nourished. No distress.  Cardiovascular: Normal rate, regular rhythm and normal heart sounds.  No murmur heard. Pulmonary/Chest: Effort normal and breath sounds normal. No respiratory distress. He has no wheezes. He has no rales.  Musculoskeletal: He exhibits no edema.  Nursing note and vitals reviewed.  Results for orders placed or performed in visit on 06/20/18  Microalbumin / creatinine urine ratio  Result Value Ref Range   Microalb, Ur 117.6 (H) 0.0 - 1.9 mg/dL   Creatinine,U 220.7 mg/dL   Microalb Creat Ratio 53.3 (H) 0.0 - 30.0 mg/g  HM DIABETES EYE EXAM    Result Value Ref Range   HM Diabetic Eye Exam No Retinopathy No Retinopathy      Assessment & Plan:   Problem List Items Addressed This Visit    Essential hypertension - Primary    Tolerating valsartan 80mg well, BP stable to mildly elevated in office today.  Continue this medication - will send to mail order.  I have asked him to start monitoring BP at home and will let me know if consisently high 130s or 140s systolic - would then increase valsartan to 160mg daily.  Pt agrees with plan.       Relevant Medications   valsartan (DIOVAN) 80 MG tablet    Other Visit Diagnoses    Need for influenza vaccination       Relevant Orders   Flu Vaccine QUAD 36+ mos IM (Completed)       Meds ordered this encounter  Medications  . valsartan (DIOVAN) 80 MG tablet     Sig: Take 1 tablet (80 mg total) by mouth daily.    Dispense:  90 tablet    Refill:  3   Orders Placed This Encounter  Procedures  . Flu Vaccine QUAD 36+ mos IM    Follow up plan: Return in about 4 months (around 01/04/2019) for follow up visit.  Javier Gutierrez, MD   

## 2019-08-04 ENCOUNTER — Encounter: Payer: Self-pay | Admitting: Family Medicine

## 2019-08-04 ENCOUNTER — Other Ambulatory Visit: Payer: Self-pay

## 2019-08-04 ENCOUNTER — Ambulatory Visit: Payer: BC Managed Care – PPO | Admitting: Family Medicine

## 2019-08-04 VITALS — BP 140/100 | HR 86 | Temp 97.6°F | Ht 74.5 in | Wt 254.4 lb

## 2019-08-04 DIAGNOSIS — E1121 Type 2 diabetes mellitus with diabetic nephropathy: Secondary | ICD-10-CM | POA: Diagnosis not present

## 2019-08-04 DIAGNOSIS — N183 Chronic kidney disease, stage 3 unspecified: Secondary | ICD-10-CM

## 2019-08-04 DIAGNOSIS — E785 Hyperlipidemia, unspecified: Secondary | ICD-10-CM

## 2019-08-04 DIAGNOSIS — Z23 Encounter for immunization: Secondary | ICD-10-CM

## 2019-08-04 DIAGNOSIS — E669 Obesity, unspecified: Secondary | ICD-10-CM

## 2019-08-04 DIAGNOSIS — I10 Essential (primary) hypertension: Secondary | ICD-10-CM

## 2019-08-04 LAB — POCT GLYCOSYLATED HEMOGLOBIN (HGB A1C): Hemoglobin A1C: 7.9 % — AB (ref 4.0–5.6)

## 2019-08-04 MED ORDER — CARVEDILOL 25 MG PO TABS: 25.0000 mg | ORAL_TABLET | Freq: Two times a day (BID) | ORAL | 3 refills | Status: DC

## 2019-08-04 MED ORDER — VALSARTAN 80 MG PO TABS: 80.0000 mg | ORAL_TABLET | Freq: Every day | ORAL | 3 refills | Status: DC

## 2019-08-04 MED ORDER — SIMVASTATIN 20 MG PO TABS: 20.0000 mg | ORAL_TABLET | Freq: Every day | ORAL | 3 refills | Status: DC

## 2019-08-04 MED ORDER — SITAGLIPTIN PHOSPHATE 100 MG PO TABS: 100.0000 mg | ORAL_TABLET | Freq: Every day | ORAL | 3 refills | Status: DC

## 2019-08-04 NOTE — Assessment & Plan Note (Signed)
Chronic. Update kidney function when he returns fasting.

## 2019-08-04 NOTE — Assessment & Plan Note (Signed)
Update FLP on statin  

## 2019-08-04 NOTE — Assessment & Plan Note (Signed)
Chronic, borderline high. Will work towards diet changes to control HTN, if remaining elevated next visit will add medication.

## 2019-08-04 NOTE — Progress Notes (Signed)
This visit was conducted in person.  BP (!) 140/100 (BP Location: Right Arm, Patient Position: Sitting, Cuff Size: Large)   Pulse 86   Temp 97.6 F (36.4 C) (Temporal)   Ht 6' 2.5" (1.892 m)   Wt 254 lb 6 oz (115.4 kg)   SpO2 98%   BMI 32.22 kg/m   Similar on repeat  CC: 6 mo f/u visit Subjective:    Patient ID: Rodney Ortiz, male    DOB: 1976/11/02, 43 y.o.   MRN: [id number]  HPI: Rodney Ortiz is a 43 y.o. male presenting on 08/04/2019 for Follow-up   HTN - Compliant with current antihypertensive regimen of valsartan '80mg'$  daily, carvedilol '25mg'$  bid. Does check blood pressures at home: 130-90s.  No low blood pressure readings or symptoms of dizziness/syncope.  Denies HA, vision changes, CP/tightness, SOB, leg swelling.    DM - does not regularly check sugars. Compliant with antihyperglycemic regimen which includes: [place] '50mg'$  daily. Denies low sugars or hypoglycemic symptoms. Occasional foot paresthesias. Last diabetic eye exam due. Pneumovax: 2015. Prevnar: not due. Glucometer brand: onetouch. DSME: 2018.  Lab Results  Component Value Date   HGBA1C 7.9 (A) 08/04/2019   Diabetic Foot Exam - Simple   Simple Foot Form Diabetic Foot exam was performed with the following findings: Yes 08/04/2019  9:28 AM  Visual Inspection No deformities, no ulcerations, no other skin breakdown bilaterally: Yes Sensation Testing Intact to touch and monofilament testing bilaterally: Yes Pulse Check Posterior Tibialis and Dorsalis pulse intact bilaterally: Yes Comments    Lab Results  Component Value Date   MICROALBUR 117.6 (H) 06/20/2018       Relevant past medical, surgical, family and social history reviewed and updated as indicated. Interim medical history since our last visit reviewed. Allergies and medications reviewed and updated. Outpatient Medications Prior to Visit  Medication Sig Dispense Refill  . Blood Glucose Monitoring Suppl (ONE TOUCH ULTRA MINI) w/Device KIT 1 kit by  Does not apply route as directed. 1 each 0  . cholecalciferol (VITAMIN D) 1000 units tablet Take 1,000 Units by mouth daily.    . fluticasone (FLONASE) 50 MCG/ACT nasal spray Place 1 spray into both nostrils daily. (Patient taking differently: Place 1 spray into both nostrils daily. As needed, seasonally) 16 g 3  . glucose blood (ONE TOUCH ULTRA TEST) test strip Use as instructed to check sugars daily and as needed 100 each 3  . ONETOUCH DELICA LANCETS 21J MISC Check blood sugar daily and as directed or needed. 100 each 2  . carvedilol (COREG) 25 MG tablet Take 1 tablet (25 mg total) by mouth 2 (two) times daily with a meal. 180 tablet 3  . simvastatin (ZOCOR) 20 MG tablet Take 1 tablet (20 mg total) by mouth at bedtime. 90 tablet 3  . sitaGLIPtin (JANUVIA) 50 MG tablet Take 1 tablet (50 mg total) by mouth daily. 90 tablet 3  . valsartan (DIOVAN) 80 MG tablet Take 1 tablet (80 mg total) by mouth daily. 90 tablet 3   No facility-administered medications prior to visit.      Per HPI unless specifically indicated in ROS section below Review of Systems Objective:    BP (!) 140/100 (BP Location: Right Arm, Patient Position: Sitting, Cuff Size: Large)   Pulse 86   Temp 97.6 F (36.4 C) (Temporal)   Ht 6' 2.5" (1.892 m)   Wt 254 lb 6 oz (115.4 kg)   SpO2 98%   BMI 32.22 kg/m   Wt  Readings from Last 3 Encounters:  08/04/19 254 lb 6 oz (115.4 kg)  09/03/18 253 lb 8 oz (115 kg)  06/20/18 249 lb 12 oz (113.3 kg)    Physical Exam Vitals signs and nursing note reviewed.  Constitutional:      General: He is not in acute distress.    Appearance: He is well-developed.  HENT:     Head: Normocephalic and atraumatic.     Right Ear: External ear normal.     Left Ear: External ear normal.     Nose: Nose normal.     Mouth/Throat:     Pharynx: No oropharyngeal exudate.  Eyes:     General: No scleral icterus.    Conjunctiva/sclera: Conjunctivae normal.     Pupils: Pupils are equal, round, and  reactive to light.  Neck:     Musculoskeletal: Normal range of motion and neck supple.  Cardiovascular:     Rate and Rhythm: Normal rate and regular rhythm.     Heart sounds: Normal heart sounds. No murmur.  Pulmonary:     Effort: Pulmonary effort is normal. No respiratory distress.     Breath sounds: Normal breath sounds. No wheezing or rales.  Musculoskeletal:     Comments: See HPI for foot exam if done  Lymphadenopathy:     Cervical: No cervical adenopathy.  Skin:    General: Skin is warm and dry.     Findings: No rash.       Results for orders placed or performed in visit on 08/04/19  POCT glycosylated hemoglobin (Hb A1C)  Result Value Ref Range   Hemoglobin A1C 7.9 (A) 4.0 - 5.6 %   HbA1c POC (<> result, manual entry)     HbA1c, POC (prediabetic range)     HbA1c, POC (controlled diabetic range)     Assessment & Plan:   Problem List Items Addressed This Visit    Obesity, Class I, BMI 30-34.9   HLD (hyperlipidemia)    Update FLP on statin.       Relevant Medications   carvedilol (COREG) 25 MG tablet   simvastatin (ZOCOR) 20 MG tablet   valsartan (DIOVAN) 80 MG tablet   Other Relevant Orders   Lipid panel   Comprehensive metabolic panel   Essential hypertension    Chronic, borderline high. Will work towards diet changes to control HTN, if remaining elevated next visit will add medication.       Relevant Medications   carvedilol (COREG) 25 MG tablet   simvastatin (ZOCOR) 20 MG tablet   valsartan (DIOVAN) 80 MG tablet   Controlled diabetes mellitus with diabetic nephropathy, without long-term current use of insulin (HCC) - Primary    Chronic, deteriorated. Encouraged renewed efforts at diabetic diet. Discussed weekly GLP1 RA. He prefers to increase [place] to full dose and work on diet/lifestyle. Reassess at 3 month f/u visit. Pt agrees with plan.       Relevant Medications   simvastatin (ZOCOR) 20 MG tablet   sitaGLIPtin (JANUVIA) 100 MG tablet   valsartan  (DIOVAN) 80 MG tablet   Other Relevant Orders   POCT glycosylated hemoglobin (Hb A1C) (Completed)   Chronic renal insufficiency, stage III (moderate) (HCC)    Chronic. Update kidney function when he returns fasting.        Other Visit Diagnoses    Need for influenza vaccination       Relevant Orders   Flu Vaccine QUAD 36+ mos IM (Completed)       Meds ordered  this encounter  Medications  . carvedilol (COREG) 25 MG tablet    Sig: Take 1 tablet (25 mg total) by mouth 2 (two) times daily with a meal.    Dispense:  180 tablet    Refill:  3  . simvastatin (ZOCOR) 20 MG tablet    Sig: Take 1 tablet (20 mg total) by mouth at bedtime.    Dispense:  90 tablet    Refill:  3  . sitaGLIPtin (JANUVIA) 100 MG tablet    Sig: Take 1 tablet (100 mg total) by mouth daily.    Dispense:  90 tablet    Refill:  3  . valsartan (DIOVAN) 80 MG tablet    Sig: Take 1 tablet (80 mg total) by mouth daily.    Dispense:  90 tablet    Refill:  3   Orders Placed This Encounter  Procedures  . Flu Vaccine QUAD 36+ mos IM  . Lipid panel    Standing Status:   Future    Standing Expiration Date:   08/03/2020  . Comprehensive metabolic panel    Standing Status:   Future    Standing Expiration Date:   08/03/2020  . POCT glycosylated hemoglobin (Hb A1C)    Follow up plan: Return in about 3 months (around 11/03/2019) for annual exam, prior fasting for blood work.  Ria Bush, MD

## 2019-08-04 NOTE — Assessment & Plan Note (Signed)
Chronic, deteriorated. Encouraged renewed efforts at diabetic diet. Discussed weekly GLP1 RA. He prefers to increase [place] to full dose and work on diet/lifestyle. Reassess at 3 month f/u visit. Pt agrees with plan.

## 2019-08-04 NOTE — Patient Instructions (Addendum)
Schedule lab visit only for Thursday am Sugar levels were elevated - increase januvia to 100mg  daily, work on closer adherence to diabetic diet Return in 3 months for physical.  Flu shot today

## 2019-08-06 ENCOUNTER — Other Ambulatory Visit: Payer: Self-pay

## 2019-08-06 ENCOUNTER — Other Ambulatory Visit (INDEPENDENT_AMBULATORY_CARE_PROVIDER_SITE_OTHER): Payer: BC Managed Care – PPO

## 2019-08-06 DIAGNOSIS — E785 Hyperlipidemia, unspecified: Secondary | ICD-10-CM

## 2019-08-06 LAB — COMPREHENSIVE METABOLIC PANEL
ALT: 32 U/L (ref 0–53)
AST: 23 U/L (ref 0–37)
Albumin: 3.9 g/dL (ref 3.5–5.2)
Alkaline Phosphatase: 71 U/L (ref 39–117)
BUN: 22 mg/dL (ref 6–23)
CO2: 26 mEq/L (ref 19–32)
Calcium: 9.3 mg/dL (ref 8.4–10.5)
Chloride: 105 mEq/L (ref 96–112)
Creatinine, Ser: 1.77 mg/dL — ABNORMAL HIGH (ref 0.40–1.50)
GFR: 50.52 mL/min — ABNORMAL LOW (ref >60.00)
Glucose, Bld: 200 mg/dL — ABNORMAL HIGH (ref 70–99)
Potassium: 4 mEq/L (ref 3.5–5.1)
Sodium: 139 mEq/L (ref 135–145)
Total Bilirubin: 0.7 mg/dL (ref 0.2–1.2)
Total Protein: 6.9 g/dL (ref 6.0–8.3)

## 2019-08-06 LAB — LIPID PANEL
Cholesterol: 132 mg/dL (ref 0–200)
HDL: 27.9 mg/dL — ABNORMAL LOW (ref >39.00)
NonHDL: 103.83
Total CHOL/HDL Ratio: 5
Triglycerides: 216 mg/dL — ABNORMAL HIGH (ref 0.0–149.0)
VLDL: 43.2 mg/dL — ABNORMAL HIGH (ref 0.0–40.0)

## 2019-08-06 LAB — LDL CHOLESTEROL, DIRECT: Direct LDL: 77 mg/dL

## 2019-11-18 ENCOUNTER — Telehealth: Payer: Self-pay

## 2019-11-18 NOTE — Telephone Encounter (Signed)
LVM to call clinic, pt needs COVID screen, front door and back door info 12.16.2020 TLJ

## 2019-11-19 ENCOUNTER — Other Ambulatory Visit: Payer: Self-pay | Admitting: Family Medicine

## 2019-11-19 DIAGNOSIS — E785 Hyperlipidemia, unspecified: Secondary | ICD-10-CM

## 2019-11-19 DIAGNOSIS — E1121 Type 2 diabetes mellitus with diabetic nephropathy: Secondary | ICD-10-CM

## 2019-11-19 DIAGNOSIS — N183 Chronic kidney disease, stage 3 unspecified: Secondary | ICD-10-CM

## 2019-11-20 ENCOUNTER — Other Ambulatory Visit: Payer: Self-pay

## 2019-11-20 ENCOUNTER — Other Ambulatory Visit (INDEPENDENT_AMBULATORY_CARE_PROVIDER_SITE_OTHER): Payer: BC Managed Care – PPO

## 2019-11-20 DIAGNOSIS — E785 Hyperlipidemia, unspecified: Secondary | ICD-10-CM

## 2019-11-20 DIAGNOSIS — N183 Chronic kidney disease, stage 3 unspecified: Secondary | ICD-10-CM

## 2019-11-20 DIAGNOSIS — E1121 Type 2 diabetes mellitus with diabetic nephropathy: Secondary | ICD-10-CM | POA: Diagnosis not present

## 2019-11-20 LAB — CBC WITH DIFFERENTIAL/PLATELET
Basophils Absolute: 0 10*3/uL (ref 0.0–0.1)
Basophils Relative: 0.7 % (ref 0.0–3.0)
Eosinophils Absolute: 0.2 10*3/uL (ref 0.0–0.7)
Eosinophils Relative: 4.2 % (ref 0.0–5.0)
HCT: 42.8 % (ref 39.0–52.0)
Hemoglobin: 14 g/dL (ref 13.0–17.0)
Lymphocytes Relative: 28.4 % (ref 12.0–46.0)
Lymphs Abs: 1.5 10*3/uL (ref 0.7–4.0)
MCHC: 32.7 g/dL (ref 30.0–36.0)
MCV: 81.9 fl (ref 78.0–100.0)
Monocytes Absolute: 0.4 10*3/uL (ref 0.1–1.0)
Monocytes Relative: 8 % (ref 3.0–12.0)
Neutro Abs: 3 10*3/uL (ref 1.4–7.7)
Neutrophils Relative %: 58.7 % (ref 43.0–77.0)
Platelets: 140 10*3/uL — ABNORMAL LOW (ref 150.0–400.0)
RBC: 5.22 Mil/uL (ref 4.22–5.81)
RDW: 13.2 % (ref 11.5–15.5)
WBC: 5.2 10*3/uL (ref 4.0–10.5)

## 2019-11-20 LAB — RENAL FUNCTION PANEL
Albumin: 4.1 g/dL (ref 3.5–5.2)
BUN: 19 mg/dL (ref 6–23)
CO2: 27 mEq/L (ref 19–32)
Calcium: 9.7 mg/dL (ref 8.4–10.5)
Chloride: 103 mEq/L (ref 96–112)
Creatinine, Ser: 1.7 mg/dL — ABNORMAL HIGH (ref 0.40–1.50)
GFR: 52.86 mL/min — ABNORMAL LOW (ref >60.00)
Glucose, Bld: 386 mg/dL — ABNORMAL HIGH (ref 70–99)
Phosphorus: 3.5 mg/dL (ref 2.3–4.6)
Potassium: 4.2 mEq/L (ref 3.5–5.1)
Sodium: 137 mEq/L (ref 135–145)

## 2019-11-20 LAB — LIPID PANEL
Cholesterol: 140 mg/dL (ref 0–200)
HDL: 31.5 mg/dL — ABNORMAL LOW (ref >39.00)
NonHDL: 108.84
Total CHOL/HDL Ratio: 4
Triglycerides: 223 mg/dL — ABNORMAL HIGH (ref 0.0–149.0)
VLDL: 44.6 mg/dL — ABNORMAL HIGH (ref 0.0–40.0)

## 2019-11-20 LAB — LDL CHOLESTEROL, DIRECT: Direct LDL: 60 mg/dL

## 2019-11-20 LAB — MICROALBUMIN / CREATININE URINE RATIO
Creatinine,U: 76 mg/dL
Microalb Creat Ratio: 84.7 mg/g — ABNORMAL HIGH (ref 0.0–30.0)
Microalb, Ur: 64.4 mg/dL — ABNORMAL HIGH (ref 0.0–1.9)

## 2019-11-20 LAB — HEMOGLOBIN A1C: Hgb A1c MFr Bld: 11.6 % — ABNORMAL HIGH (ref 4.6–6.5)

## 2019-11-20 LAB — VITAMIN D 25 HYDROXY (VIT D DEFICIENCY, FRACTURES): VITD: 39.53 ng/mL (ref 30.00–100.00)

## 2019-11-23 ENCOUNTER — Other Ambulatory Visit: Payer: Self-pay

## 2019-11-23 ENCOUNTER — Ambulatory Visit: Payer: BC Managed Care – PPO | Admitting: Family Medicine

## 2019-11-23 ENCOUNTER — Encounter: Payer: Self-pay | Admitting: Family Medicine

## 2019-11-23 VITALS — BP 136/100 | HR 98 | Temp 98.3°F | Ht 74.0 in | Wt 248.1 lb

## 2019-11-23 DIAGNOSIS — Z0001 Encounter for general adult medical examination with abnormal findings: Secondary | ICD-10-CM | POA: Diagnosis not present

## 2019-11-23 DIAGNOSIS — E785 Hyperlipidemia, unspecified: Secondary | ICD-10-CM

## 2019-11-23 DIAGNOSIS — E1169 Type 2 diabetes mellitus with other specified complication: Secondary | ICD-10-CM

## 2019-11-23 DIAGNOSIS — E1121 Type 2 diabetes mellitus with diabetic nephropathy: Secondary | ICD-10-CM

## 2019-11-23 DIAGNOSIS — E1122 Type 2 diabetes mellitus with diabetic chronic kidney disease: Secondary | ICD-10-CM | POA: Diagnosis not present

## 2019-11-23 DIAGNOSIS — I10 Essential (primary) hypertension: Secondary | ICD-10-CM

## 2019-11-23 DIAGNOSIS — E66811 Obesity, class 1: Secondary | ICD-10-CM

## 2019-11-23 DIAGNOSIS — E669 Obesity, unspecified: Secondary | ICD-10-CM

## 2019-11-23 DIAGNOSIS — Z1211 Encounter for screening for malignant neoplasm of colon: Secondary | ICD-10-CM

## 2019-11-23 DIAGNOSIS — N183 Chronic kidney disease, stage 3 unspecified: Secondary | ICD-10-CM

## 2019-11-23 MED ORDER — VALSARTAN 160 MG PO TABS: 160.0000 mg | ORAL_TABLET | Freq: Every day | ORAL | 3 refills | Status: DC

## 2019-11-23 MED ORDER — BASAGLAR KWIKPEN 100 UNIT/ML ~~LOC~~ SOPN: 20.0000 [IU] | PEN_INJECTOR | Freq: Every day | SUBCUTANEOUS | 3 refills | Status: DC

## 2019-11-23 MED ORDER — INSULIN PEN NEEDLE 33G X 6 MM MISC: 1.0000 [IU] | 3 refills | Status: DC

## 2019-11-23 NOTE — Assessment & Plan Note (Signed)
Chronic, stable on statin. Continue . The 10-year ASCVD risk score Rodney Ortiz DC Brooke Bonito., et al., 2013) is: 14.5%   Values used to calculate the score:     Age: 43 years     Sex: Male     Is Non-Hispanic African American: Yes     Diabetic: Yes     Tobacco smoker: No     Systolic Blood Pressure: 644 mmHg     Is BP treated: Yes     HDL Cholesterol: 31.5 mg/dL     Total Cholesterol: 140 mg/dL

## 2019-11-23 NOTE — Progress Notes (Addendum)
This visit was conducted in person.  BP (!) 136/100 (BP Location: Right Arm, Patient Position: Sitting, Cuff Size: Large)   Pulse 98   Temp 98.3 F (36.8 C) (Temporal)   Ht '6\' 2"'$  (1.88 m)   Wt 248 lb 2 oz (112.5 kg)   SpO2 97%   BMI 31.86 kg/m   150/105 on repeat  CC: CPE Subjective:    Patient ID: Rodney Ortiz, male    DOB: 09/19/1976, 43 y.o.   MRN: [id number]  HPI: Rodney Ortiz is a 43 y.o. male presenting on 11/23/2019 for Annual Exam   HTN - on valsartan '80mg'$  daily, carvedilol '25mg'$  bid. Has not been checking BP at home.  HLD - on simvastatin '20mg'$  daily.  DM - on januvia '100mg'$  daily. Poor diet choices. Has not been checking sugars. Did not tolerate metformin due to GI upset, we also were avoiding in h/o CKD.   Preventative: Colon cancer screening - discussed new colon cancer screening recommendations to start at 45yo. Would like iFOB.  Prostate cancer screening - no known fmhx prostate cancer. Consider checking PSA next labs.  Flu shot yearly  Tdap 2015  Pneumovax 2015  Seat belt use discussed  Sunscreen use discussed.No changing moles on skin  Non smoker  Alcohol - none Dentist - hasn't seen recently  Eye exam - sees yearly. Started using bifocals.   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 Interim medical history since our last visit reviewed. Allergies and medications reviewed and updated. Outpatient Medications Prior to Visit  Medication Sig Dispense Refill  . Blood Glucose Monitoring Suppl (ONE TOUCH ULTRA MINI) w/Device KIT 1 kit by Does not apply route as directed. 1 each 0  . carvedilol (COREG) 25 MG tablet Take 1 tablet (25 mg total) by mouth 2 (two) times daily with a meal. 180 tablet 3  . cholecalciferol (VITAMIN D) 1000 units tablet  Take 1,000 Units by mouth daily.    . fluticasone (FLONASE) 50 MCG/ACT nasal spray Place 1 spray into both nostrils daily. (Patient taking differently: Place 1 spray into both nostrils daily. As needed, seasonally) 16 g 3  . glucose blood (ONE TOUCH ULTRA TEST) test strip Use as instructed to check sugars daily and as needed 100 each 3  . ONETOUCH DELICA LANCETS 41Y MISC Check blood sugar daily and as directed or needed. 100 each 2  . simvastatin (ZOCOR) 20 MG tablet Take 1 tablet (20 mg total) by mouth at bedtime. 90 tablet 3  . sitaGLIPtin (JANUVIA) 100 MG tablet Take 1 tablet (100 mg total) by mouth daily. 90 tablet 3  . valsartan (DIOVAN) 80 MG tablet Take 1 tablet (80 mg total) by mouth daily. 90 tablet 3   No facility-administered medications prior to visit.     Per HPI unless specifically indicated in ROS section below Review of Systems  Constitutional: Negative for activity change, appetite change, chills, fatigue, fever and unexpected weight change.       Overall feeling well  HENT: Negative for hearing loss.   Eyes: Negative for visual disturbance.  Respiratory: Negative for cough, chest tightness, shortness of breath and wheezing.   Cardiovascular: Negative for chest pain, palpitations and leg swelling.  Gastrointestinal: Negative for abdominal distention, abdominal pain, blood in stool, constipation, diarrhea, nausea and vomiting.  Genitourinary: Negative for difficulty urinating and hematuria.  Musculoskeletal: Negative for arthralgias, myalgias and neck pain.  Skin: Negative for rash.  Neurological: Negative for dizziness, seizures, syncope and headaches.  Hematological: Negative for adenopathy. Does not bruise/bleed easily.  Psychiatric/Behavioral: Negative for dysphoric mood. The patient is not nervous/anxious.    Objective:    BP (!) 136/100 (BP Location: Right Arm, Patient Position: Sitting, Cuff Size: Large)   Pulse 98   Temp 98.3 F (36.8 C) (Temporal)   Ht '6\' 2"'$   (1.88 m)   Wt 248 lb 2 oz (112.5 kg)   SpO2 97%   BMI 31.86 kg/m   Wt Readings from Last 3 Encounters:  11/23/19 248 lb 2 oz (112.5 kg)  08/04/19 254 lb 6 oz (115.4 kg)  09/03/18 253 lb 8 oz (115 kg)    Physical Exam Vitals and nursing note reviewed.  Constitutional:      General: He is not in acute distress.    Appearance: Normal appearance. He is well-developed. He is obese. He is not ill-appearing.  HENT:     Head: Normocephalic and atraumatic.     Right Ear: Hearing, tympanic membrane, ear canal and external ear normal.     Left Ear: Hearing, tympanic membrane, ear canal and external ear normal.     Nose: Nose normal.     Mouth/Throat:     Pharynx: Uvula midline.  Eyes:     General: No scleral icterus.    Extraocular Movements: Extraocular movements intact.     Conjunctiva/sclera: Conjunctivae normal.     Pupils: Pupils are equal, round, and reactive to light.  Neck:     Thyroid: No thyromegaly or thyroid tenderness.  Cardiovascular:     Rate and Rhythm: Normal rate and regular rhythm.     Pulses: Normal pulses.          Radial pulses are 2+ on the right side and 2+ on the left side.     Heart sounds: Normal heart sounds. No murmur.  Pulmonary:     Effort: Pulmonary effort is normal. No respiratory distress.     Breath sounds: Normal breath sounds. No wheezing, rhonchi or rales.  Abdominal:     General: Abdomen is flat. Bowel sounds are normal. There is no distension.     Palpations: Abdomen is soft. There is no mass.     Tenderness: There is no abdominal tenderness. There is no guarding or rebound.     Hernia: No hernia is present.  Musculoskeletal:        General: Normal range of motion.     Cervical back: Normal range of motion and neck supple.     Right lower leg: No edema.     Left lower leg: No edema.  Lymphadenopathy:     Cervical: No cervical adenopathy.  Skin:    General: Skin is warm and dry.     Findings: No rash.  Neurological:     General: No  focal deficit present.     Mental Status: He is alert and oriented to person, place, and time.     Comments: CN grossly intact, station and gait intact  Psychiatric:        Mood and Affect: Mood normal.        Behavior: Behavior normal.        Thought Content: Thought content normal.        Judgment: Judgment normal.       Results for orders placed or performed in visit  on 11/20/19  Microalbumin / creatinine urine ratio  Result Value Ref Range   Microalb, Ur 64.4 (H) 0.0 - 1.9 mg/dL   Creatinine,U 76.0 mg/dL   Microalb Creat Ratio 84.7 (H) 0.0 - 30.0 mg/g  Lipid panel  Result Value Ref Range   Cholesterol 140 0 - 200 mg/dL   Triglycerides 223.0 (H) 0.0 - 149.0 mg/dL   HDL 31.50 (L) >39.00 mg/dL   VLDL 44.6 (H) 0.0 - 40.0 mg/dL   Total CHOL/HDL Ratio 4    NonHDL 108.84   vit d  Result Value Ref Range   VITD 39.53 30.00 - 100.00 ng/mL  CBC with Differential  Result Value Ref Range   WBC 5.2 4.0 - 10.5 K/uL   RBC 5.22 4.22 - 5.81 Mil/uL   Hemoglobin 14.0 13.0 - 17.0 g/dL   HCT 42.8 39.0 - 52.0 %   MCV 81.9 78.0 - 100.0 fl   MCHC 32.7 30.0 - 36.0 g/dL   RDW 13.2 11.5 - 15.5 %   Platelets 140.0 (L) 150.0 - 400.0 K/uL   Neutrophils Relative % 58.7 43.0 - 77.0 %   Lymphocytes Relative 28.4 12.0 - 46.0 %   Monocytes Relative 8.0 3.0 - 12.0 %   Eosinophils Relative 4.2 0.0 - 5.0 %   Basophils Relative 0.7 0.0 - 3.0 %   Neutro Abs 3.0 1.4 - 7.7 K/uL   Lymphs Abs 1.5 0.7 - 4.0 K/uL   Monocytes Absolute 0.4 0.1 - 1.0 K/uL   Eosinophils Absolute 0.2 0.0 - 0.7 K/uL   Basophils Absolute 0.0 0.0 - 0.1 K/uL  Hemoglobin A1c  Result Value Ref Range   Hgb A1c MFr Bld 11.6 (H) 4.6 - 6.5 %  Renal panel  Result Value Ref Range   Sodium 137 135 - 145 mEq/L   Potassium 4.2 3.5 - 5.1 mEq/L   Chloride 103 96 - 112 mEq/L   CO2 27 19 - 32 mEq/L   Albumin 4.1 3.5 - 5.2 g/dL   BUN 19 6 - 23 mg/dL   Creatinine, Ser 1.70 (H) 0.40 - 1.50 mg/dL   Glucose, Bld 386 (H) 70 - 99 mg/dL    Phosphorus 3.5 2.3 - 4.6 mg/dL   GFR 52.86 (L) >60.00 mL/min   Calcium 9.7 8.4 - 10.5 mg/dL  LDL cholesterol, direct  Result Value Ref Range   Direct LDL 60.0 mg/dL   Assessment & Plan:  This visit occurred during the SARS-CoV-2 public health emergency.  Safety protocols were in place, including screening questions prior to the visit, additional usage of staff PPE, and extensive cleaning of exam room while observing appropriate contact time as indicated for disinfecting solutions.   Problem List Items Addressed This Visit    Obesity, Class I, BMI 30-34.9   Hyperlipidemia associated with type 2 diabetes mellitus (HCC)    Chronic, stable on statin. Continue . The 10-year ASCVD risk score Mikey Bussing DC Brooke Bonito., et al., 2013) is: 14.5%   Values used to calculate the score:     Age: 48 years     Sex: Male     Is Non-Hispanic African American: Yes     Diabetic: Yes     Tobacco smoker: No     Systolic Blood Pressure: 163 mmHg     Is BP treated: Yes     HDL Cholesterol: 31.5 mg/dL     Total Cholesterol: 140 mg/dL       Relevant Medications   valsartan (DIOVAN) 160 MG tablet   Insulin Glargine (  BASAGLAR KWIKPEN) 100 UNIT/ML SOPN   Essential hypertension    Chronic, deteriorated. Increase valsartan to '180mg'$  daily. Reassess at f/u visit.       Relevant Medications   valsartan (DIOVAN) 160 MG tablet   Encounter for routine adult health examination with abnormal findings - Primary    Preventative protocols reviewed and updated unless pt declined. Discussed healthy diet and lifestyle.       Controlled diabetes mellitus with diabetic nephropathy, without long-term current use of insulin (HCC)    Chronic, sudden deterioration over the past 3 months. Reviewed in detail with patient. Pt agrees to start long acting insulin, in hopes of giving pancreas relief - will send basaglar 20u daily (aiming for 0.2u/kg given high A1c). Reviewed correct administration of insulin. Discussed return for nurse visit  or review with pharmacist. Given sudden onset worsening associated with weight loss, check abd [place] to eval pancreas, liver.  In CKD consider GLP1 RA and SGLT2 inhibitor.       Relevant Medications   valsartan (DIOVAN) 160 MG tablet   Insulin Glargine (BASAGLAR KWIKPEN) 100 UNIT/ML SOPN   Other Relevant Orders   US Abdomen Complete   CKD stage 3 due to type 2 diabetes mellitus (HCC)    Chronic, stable. Worsening microalbuminuria noted. Will increase ARB dose. Reviewed importance of tighter glycemic control to help maintain kidney function.      Relevant Medications   valsartan (DIOVAN) 160 MG tablet   Insulin Glargine (BASAGLAR KWIKPEN) 100 UNIT/ML SOPN   Other Relevant Orders   US Abdomen Complete    Other Visit Diagnoses    Special screening for malignant neoplasms, colon       Relevant Orders   Fecal occult blood, imunochemical       Meds ordered this encounter  Medications  . valsartan (DIOVAN) 160 MG tablet    Sig: Take 1 tablet (160 mg total) by mouth daily.    Dispense:  90 tablet    Refill:  3    Note new dose  . Insulin Glargine (BASAGLAR KWIKPEN) 100 UNIT/ML SOPN    Sig: Inject 0.2 mLs (20 Units total) into the skin daily.    Dispense:  6 mL    Refill:  3  . Insulin Pen Needle 33G X 6 MM MISC    Sig: 1 Units by Does not apply route as directed. Use with basaglar pen for daily insulin shot    Dispense:  100 each    Refill:  3   Orders Placed This Encounter  Procedures  . Fecal occult blood, imunochemical    Standing Status:   Future    Standing Expiration Date:   11/22/2020  . US Abdomen Complete    Standing Status:   Future    Standing Expiration Date:   01/23/2021    Order Specific Question:   Reason for Exam (SYMPTOM  OR DIAGNOSIS REQUIRED)    Answer:   diabetes with acute deterioration and weight loss    Order Specific Question:   Preferred imaging location?    Answer:   [facility]    Patient instructions: Pass by lab to pick up stool kit.   Sugars were uncontrolled with A1c 11.9%.  I recommend starting lantus long acting insulin - sent to your pharmacy. You may return here for insulin teaching or have pharmacist review administration of insulin.  Increase valsartan to '160mg'$  daily We will set you up for abdominal ultrasound given sudden worsening sugar control.  Return in  3 months for diabetes follow up.   Follow up plan: Return in about 3 months (around 02/21/2020) for follow up visit.  Ria Bush, MD

## 2019-11-23 NOTE — Assessment & Plan Note (Signed)
Chronic, stable. Worsening microalbuminuria noted. Will increase ARB dose. Reviewed importance of tighter glycemic control to help maintain kidney function.

## 2019-11-23 NOTE — Assessment & Plan Note (Signed)
Chronic, deteriorated. Increase valsartan to 180mg  daily. Reassess at f/u visit.

## 2019-11-23 NOTE — Assessment & Plan Note (Addendum)
Chronic, sudden deterioration over the past 3 months. Reviewed in detail with patient. Pt agrees to start long acting insulin, in hopes of giving pancreas relief - will send basaglar 20u daily (aiming for 0.2u/kg given high A1c). Reviewed correct administration of insulin. Discussed return for nurse visit or review with pharmacist. Given sudden onset worsening associated with weight loss, check abd [place] to eval pancreas, liver.  In CKD consider GLP1 RA and SGLT2 inhibitor.

## 2019-11-23 NOTE — Assessment & Plan Note (Signed)
Preventative protocols reviewed and updated unless pt declined. Discussed healthy diet and lifestyle.  

## 2019-11-23 NOTE — Patient Instructions (Addendum)
Pass by lab to pick up stool kit.  Sugars were uncontrolled with A1c 11.9%.  I recommend starting lantus long acting insulin - sent to your pharmacy. You may return here for insulin teaching or have pharmacist review administration of insulin.  Increase valsartan to '160mg'$  daily We will set you up for abdominal ultrasound given sudden worsening sugar control.  Return in 3 months for diabetes follow up.   Diabetes Mellitus and Nutrition, Adult When you have diabetes (diabetes mellitus), it is very important to have healthy eating habits because your blood sugar (glucose) levels are greatly affected by what you eat and drink. Eating healthy foods in the appropriate amounts, at about the same times every day, can help you:  Control your blood glucose.  Lower your risk of heart disease.  Improve your blood pressure.  Reach or maintain a healthy weight. Every person with diabetes is different, and each person has different needs for a meal plan. Your health care provider may recommend that you work with a diet and nutrition specialist (dietitian) to make a meal plan that is best for you. Your meal plan may vary depending on factors such as:  The calories you need.  The medicines you take.  Your weight.  Your blood glucose, blood pressure, and cholesterol levels.  Your activity level.  Other health conditions you have, such as heart or kidney disease. How do carbohydrates affect me? Carbohydrates, also called carbs, affect your blood glucose level more than any other type of food. Eating carbs naturally raises the amount of glucose in your blood. Carb counting is a method for keeping track of how many carbs you eat. Counting carbs is important to keep your blood glucose at a healthy level, especially if you use insulin or take certain oral diabetes medicines. It is important to know how many carbs you can safely have in each meal. This is different for every person. Your dietitian can help  you calculate how many carbs you should have at each meal and for each snack. Foods that contain carbs include:  Bread, cereal, rice, pasta, and crackers.  Potatoes and corn.  Peas, beans, and lentils.  Milk and yogurt.  Fruit and juice.  Desserts, such as cakes, cookies, ice cream, and candy. How does alcohol affect me? Alcohol can cause a sudden decrease in blood glucose (hypoglycemia), especially if you use insulin or take certain oral diabetes medicines. Hypoglycemia can be a life-threatening condition. Symptoms of hypoglycemia (sleepiness, dizziness, and confusion) are similar to symptoms of having too much alcohol. If your health care provider says that alcohol is safe for you, follow these guidelines:  Limit alcohol intake to no more than 1 drink per day for nonpregnant women and 2 drinks per day for men. One drink equals 12 oz of beer, 5 oz of wine, or 1 oz of hard liquor.  Do not drink on an empty stomach.  Keep yourself hydrated with water, diet soda, or unsweetened iced tea.  Keep in mind that regular soda, juice, and other mixers may contain a lot of sugar and must be counted as carbs. What are tips for following this plan?  Reading food labels  Start by checking the serving size on the "Nutrition Facts" label of packaged foods and drinks. The amount of calories, carbs, fats, and other nutrients listed on the label is based on one serving of the item. Many items contain more than one serving per package.  Check the total grams (g) of carbs in  one serving. You can calculate the number of servings of carbs in one serving by dividing the total carbs by 15. For example, if a food has 30 g of total carbs, it would be equal to 2 servings of carbs.  Check the number of grams (g) of saturated and trans fats in one serving. Choose foods that have low or no amount of these fats.  Check the number of milligrams (mg) of salt (sodium) in one serving. Most people should limit total  sodium intake to less than 2,300 mg per day.  Always check the nutrition information of foods labeled as "low-fat" or "nonfat". These foods may be higher in added sugar or refined carbs and should be avoided.  Talk to your dietitian to identify your daily goals for nutrients listed on the label. Shopping  Avoid buying canned, premade, or processed foods. These foods tend to be high in fat, sodium, and added sugar.  Shop around the outside edge of the grocery store. This includes fresh fruits and vegetables, bulk grains, fresh meats, and fresh dairy. Cooking  Use low-heat cooking methods, such as baking, instead of high-heat cooking methods like deep frying.  Cook using healthy oils, such as olive, canola, or sunflower oil.  Avoid cooking with butter, cream, or high-fat meats. Meal planning  Eat meals and snacks regularly, preferably at the same times every day. Avoid going long periods of time without eating.  Eat foods high in fiber, such as fresh fruits, vegetables, beans, and whole grains. Talk to your dietitian about how many servings of carbs you can eat at each meal.  Eat 4-6 ounces (oz) of lean protein each day, such as lean meat, chicken, fish, eggs, or tofu. One oz of lean protein is equal to: ? 1 oz of meat, chicken, or fish. ? 1 egg. ?  cup of tofu.  Eat some foods each day that contain healthy fats, such as avocado, nuts, seeds, and fish. Lifestyle  Check your blood glucose regularly.  Exercise regularly as told by your health care provider. This may include: ? 150 minutes of moderate-intensity or vigorous-intensity exercise each week. This could be brisk walking, biking, or water aerobics. ? Stretching and doing strength exercises, such as yoga or weightlifting, at least 2 times a week.  Take medicines as told by your health care provider.  Do not use any products that contain nicotine or tobacco, such as cigarettes and e-cigarettes. If you need help quitting, ask  your health care provider.  Work with a Social worker or diabetes educator to identify strategies to manage stress and any emotional and social challenges. Questions to ask a health care provider  Do I need to meet with a diabetes educator?  Do I need to meet with a dietitian?  What number can I call if I have questions?  When are the best times to check my blood glucose? Where to find more information:  American Diabetes Association: diabetes.org  Academy of Nutrition and Dietetics: www.eatright.[employer] of Diabetes and Digestive and Kidney Diseases (NIH): [employer] Summary  A healthy meal plan will help you control your blood glucose and maintain a healthy lifestyle.  Working with a diet and nutrition specialist (dietitian) can help you make a meal plan that is best for you.  Keep in mind that carbohydrates (carbs) and alcohol have immediate effects on your blood glucose levels. It is important to count carbs and to use alcohol carefully. This information is not intended to replace advice given  to you by your health care provider. Make sure you discuss any questions you have with your health care provider. Document Released: 08/16/2005 Document Revised: 11/01/2017 Document Reviewed: 12/24/2016 Elsevier Patient Education  2020 [employer].

## 2019-11-24 ENCOUNTER — Ambulatory Visit (HOSPITAL_BASED_OUTPATIENT_CLINIC_OR_DEPARTMENT_OTHER)
Admission: RE | Admit: 2019-11-24 | Discharge: 2019-11-24 | Disposition: A | Discharge status: Home / Self Care | Payer: BC Managed Care – PPO | Source: Ambulatory Visit | Attending: Family Medicine | Admitting: Family Medicine

## 2019-11-24 DIAGNOSIS — E1121 Type 2 diabetes mellitus with diabetic nephropathy: Secondary | ICD-10-CM | POA: Insufficient documentation

## 2019-11-24 DIAGNOSIS — E1122 Type 2 diabetes mellitus with diabetic chronic kidney disease: Secondary | ICD-10-CM | POA: Insufficient documentation

## 2019-11-24 DIAGNOSIS — N183 Chronic kidney disease, stage 3 unspecified: Secondary | ICD-10-CM | POA: Diagnosis present

## 2019-11-25 ENCOUNTER — Other Ambulatory Visit (INDEPENDENT_AMBULATORY_CARE_PROVIDER_SITE_OTHER): Payer: BC Managed Care – PPO

## 2019-11-25 DIAGNOSIS — Z1211 Encounter for screening for malignant neoplasm of colon: Secondary | ICD-10-CM

## 2019-11-25 LAB — FECAL OCCULT BLOOD, IMMUNOCHEMICAL: Fecal Occult Bld: NEGATIVE

## 2019-11-26 ENCOUNTER — Encounter: Payer: Self-pay | Admitting: Family Medicine

## 2019-11-26 DIAGNOSIS — K760 Fatty (change of) liver, not elsewhere classified: Secondary | ICD-10-CM | POA: Insufficient documentation

## 2020-01-22 ENCOUNTER — Ambulatory Visit: Payer: BC Managed Care – PPO | Attending: Internal Medicine

## 2020-01-22 DIAGNOSIS — Z23 Encounter for immunization: Secondary | ICD-10-CM

## 2020-01-22 NOTE — Progress Notes (Signed)
   Covid-19 Vaccination Clinic  Name:  BARAK BIALECKI    MRN: [id number] DOB: [birth year]  01/22/2020  Mr. Salguero was observed post Covid-19 immunization for 15 minutes without incidence. He was provided with Vaccine Information Sheet and instruction to access the V-Safe system.   Mr. Poser was instructed to call 911 with any severe reactions post vaccine: Marland Kitchen Difficulty breathing  . Swelling of your face and throat  . A fast heartbeat  . A bad rash all over your body  . Dizziness and weakness    Immunizations Administered    Name Date Dose VIS Date Route   Pfizer COVID-19 Vaccine 01/22/2020  9:40 AM 0.3 mL 11/13/2019 Intramuscular   Manufacturer: [employer], [employer]   Lot: [id number]   NDC: [address]-7

## 2020-02-16 ENCOUNTER — Ambulatory Visit: Payer: BC Managed Care – PPO | Attending: Internal Medicine

## 2020-02-16 DIAGNOSIS — Z23 Encounter for immunization: Secondary | ICD-10-CM

## 2020-02-16 NOTE — Progress Notes (Signed)
   Covid-19 Vaccination Clinic  Name:  SABASTION HRDLICKA    MRN: [id number] DOB: 08-08-1976  02/16/2020  Mr. Scheff was observed post Covid-19 immunization for 15 minutes without incident. He was provided with Vaccine Information Sheet and instruction to access the V-Safe system.   Mr. Steers was instructed to call 911 with any severe reactions post vaccine: Marland Kitchen Difficulty breathing  . Swelling of face and throat  . A fast heartbeat  . A bad rash all over body  . Dizziness and weakness   Immunizations Administered    Name Date Dose VIS Date Route   Pfizer COVID-19 Vaccine 02/16/2020  4:33 PM 0.3 mL 11/13/2019 Intramuscular   Manufacturer: [employer], [employer]   Lot: [id number]   NDC: [address]-6

## 2020-02-26 ENCOUNTER — Ambulatory Visit: Payer: BC Managed Care – PPO | Admitting: Family Medicine

## 2020-07-08 IMAGING — US US ABDOMEN COMPLETE
2 series · 13 of 25 positions shown · non-contrast
Comparison: None.

CLINICAL DATA: Diabetes, weight loss

EXAM:
ABDOMEN ULTRASOUND COMPLETE

[Series 1: us abdomen complete · 12 of 58 slices shown (1 of 2)]
[im 1/58]
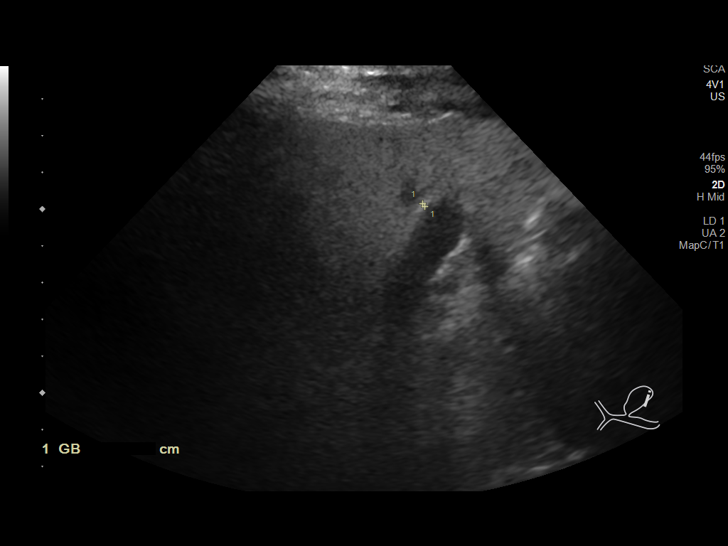
[im 5/58]
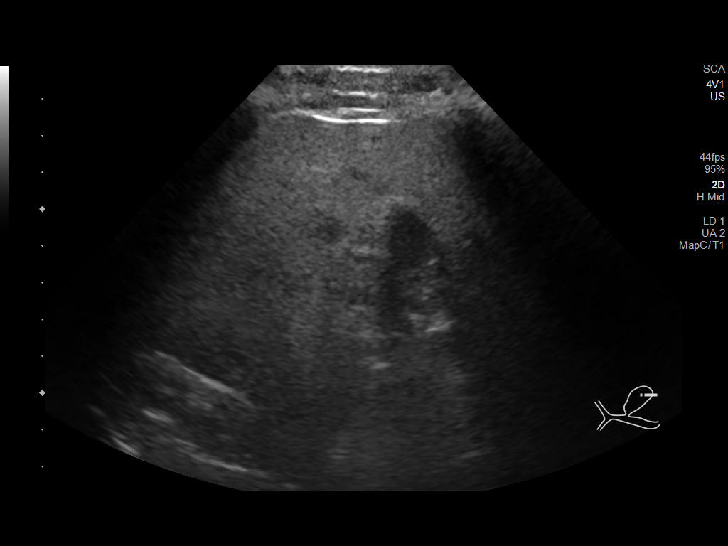
[im 10/58]
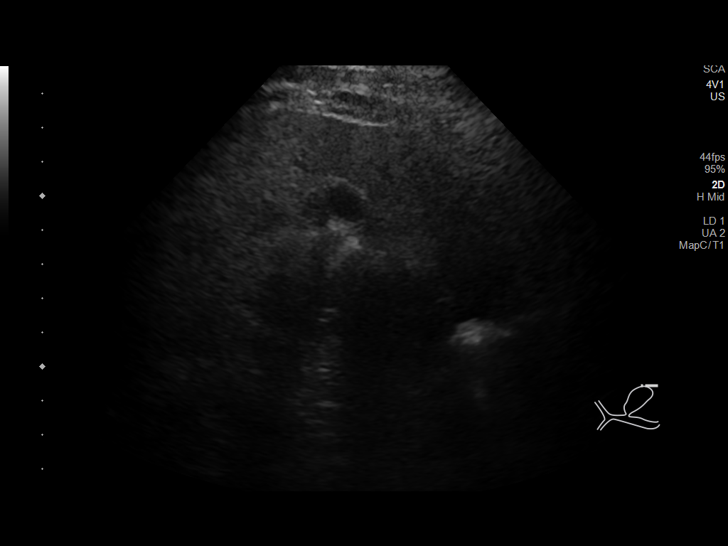
[im 15/58]
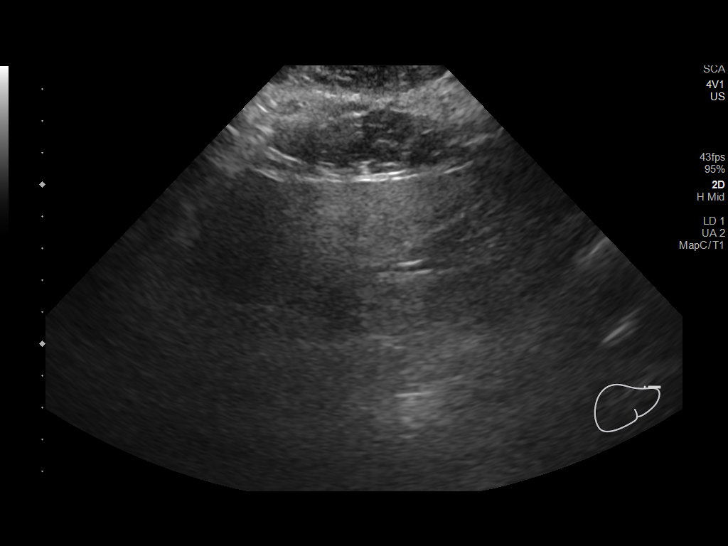
[im 20/58]
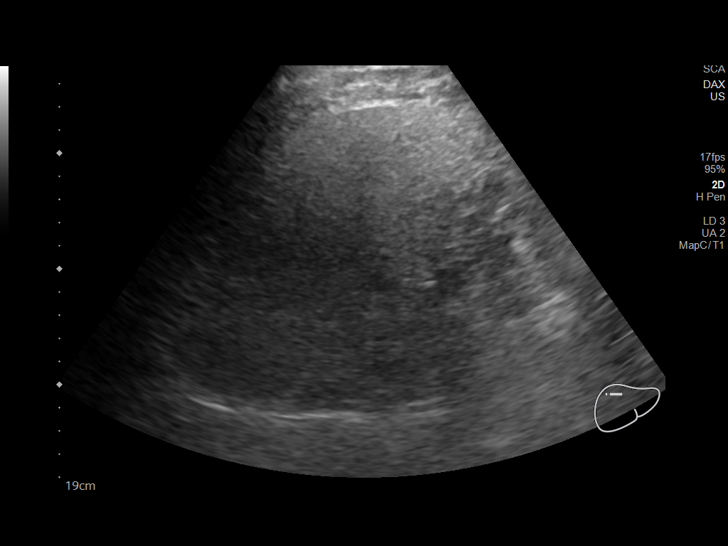
[im 25/58]
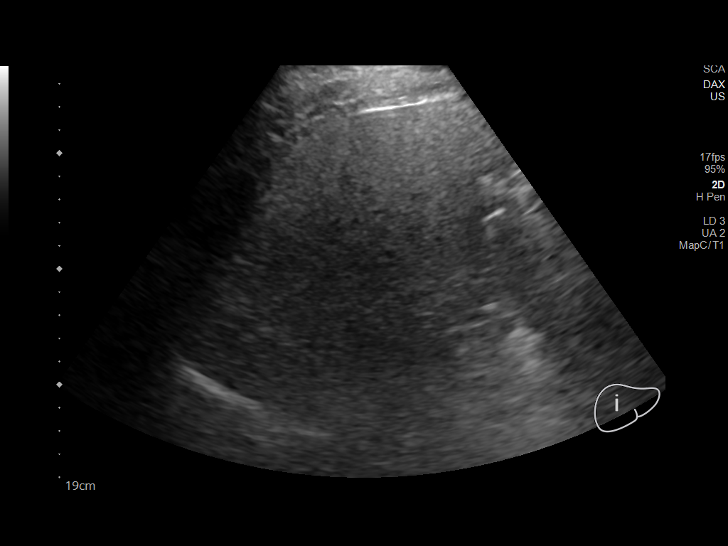
[im 30/58]
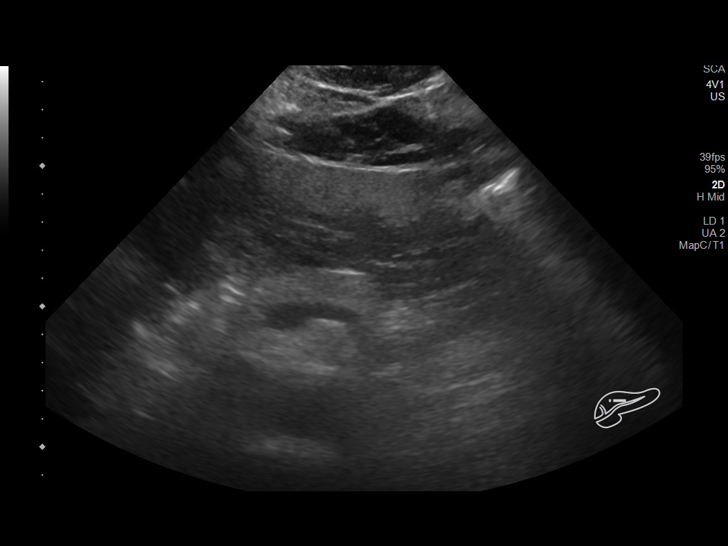
[im 35/58]
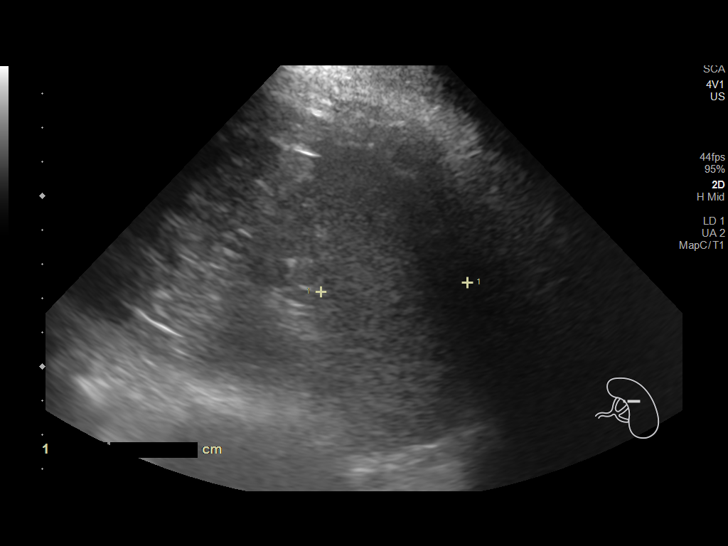
[im 40/58]
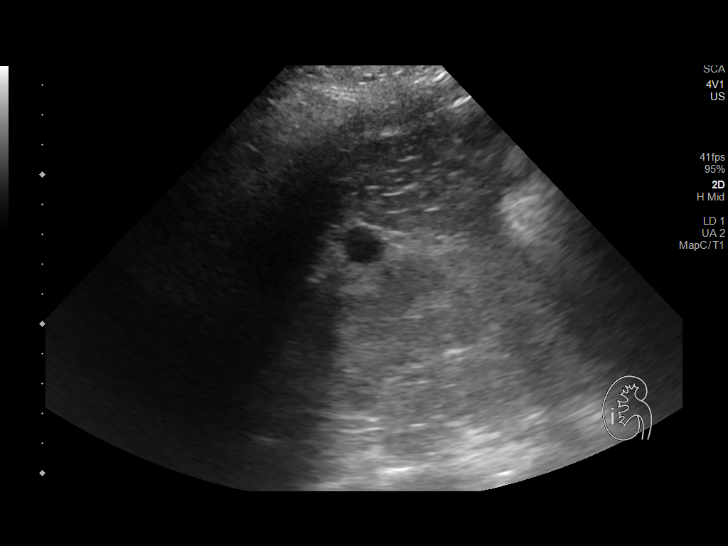
[im 45/58]
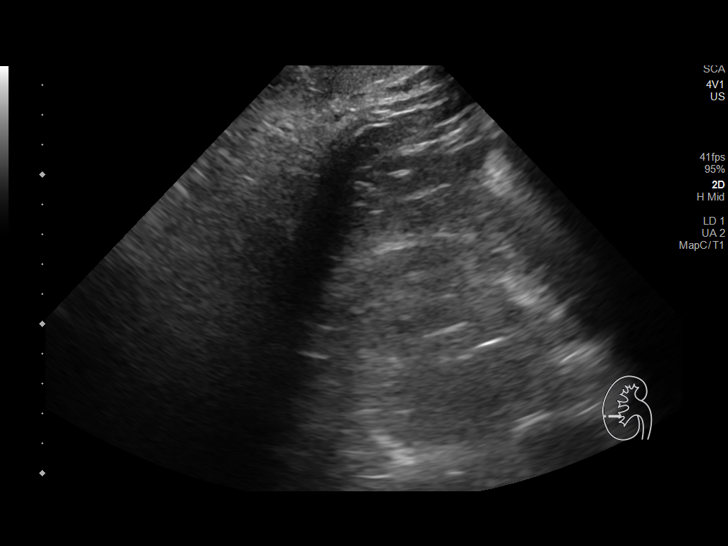
[im 50/58]
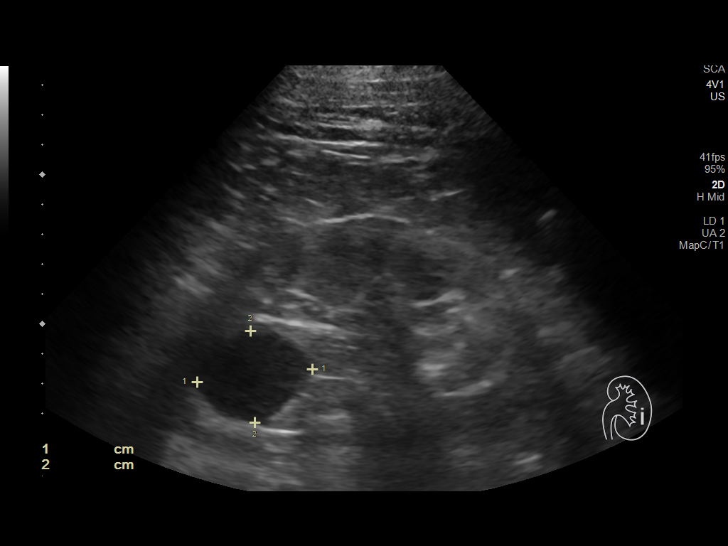
[im 55/58]
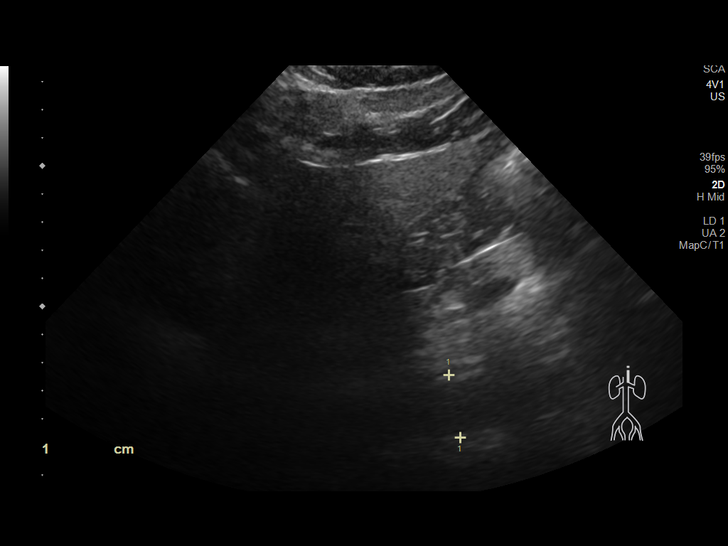

[Series 1001: us abdomen complete · 1 of 1 slices shown (2 of 2)]
[im 1/1]
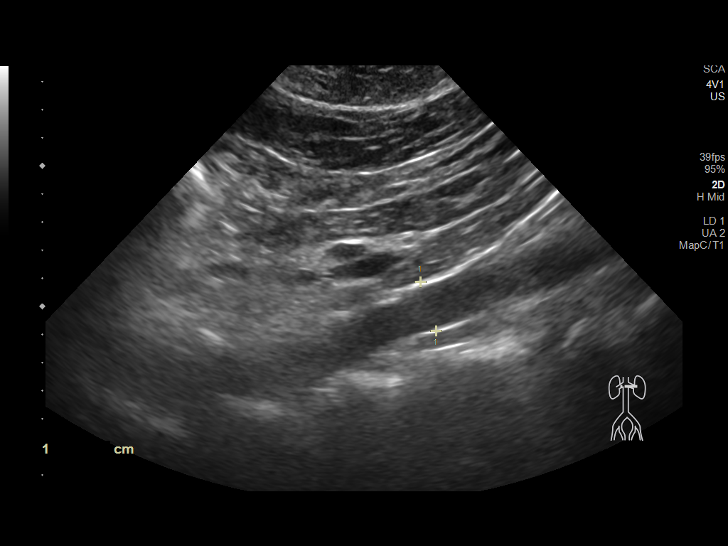

[13 of 25 positions shown; findings below may reference images not displayed]

FINDINGS: Gallbladder: Gallbladder is contracted. No visible stones or wall
thickening. Negative sonographic [place].

Common bile duct: Diameter: Normal caliber, 4 mm

Liver: Increased echotexture compatible with fatty infiltration. No
focal abnormality or biliary ductal dilatation. Portal vein is
patent on color Doppler imaging with normal direction of blood flow
towards the liver.

IVC: No abnormality visualized.

Pancreas: Visualized portion unremarkable.

Spleen: Size and appearance within normal limits.

Right Kidney: Length: 10.2 cm. 1.3 cm cyst in the midpole. Increased
echotexture. No hydronephrosis.

Left Kidney: Length: 12.2 cm. 3.9 cm simple appearing upper pole
cyst. Increased echotexture. No hydronephrosis.

Abdominal aorta: No aneurysm visualized.

Other findings: None.
IMPRESSION: Fatty infiltration of the liver.

Contracted gallbladder. No stones or evidence of acute
cholecystitis.

Increased echotexture within the kidneys bilaterally compatible with
chronic medical renal disease. No hydronephrosis.

## 2020-08-01 ENCOUNTER — Other Ambulatory Visit: Payer: Self-pay | Admitting: Family Medicine

## 2020-10-12 ENCOUNTER — Other Ambulatory Visit: Payer: Self-pay | Admitting: Family Medicine

## 2020-10-12 NOTE — Telephone Encounter (Signed)
E-scribed refill.  Plz schedule cpe and lab visits.  

## 2020-10-13 ENCOUNTER — Telehealth: Payer: Self-pay | Admitting: Family Medicine

## 2020-10-13 NOTE — Telephone Encounter (Signed)
Called and left vm for the patient to call and schedule CPE and labs. EM 10/13/20 

## 2020-11-22 LAB — HM DIABETES EYE EXAM

## 2021-01-19 ENCOUNTER — Telehealth: Payer: Self-pay | Admitting: Family Medicine

## 2021-01-19 MED ORDER — SITAGLIPTIN PHOSPHATE 100 MG PO TABS: 100.0000 mg | ORAL_TABLET | Freq: Every day | ORAL | 0 refills | Status: DC

## 2021-01-19 MED ORDER — BASAGLAR KWIKPEN 100 UNIT/ML ~~LOC~~ SOPN: 20.0000 [IU] | PEN_INJECTOR | Freq: Every day | SUBCUTANEOUS | 3 refills | Status: DC

## 2021-01-19 MED ORDER — SIMVASTATIN 20 MG PO TABS: 20.0000 mg | ORAL_TABLET | Freq: Every day | ORAL | 0 refills | Status: DC

## 2021-01-19 MED ORDER — FLUTICASONE PROPIONATE 50 MCG/ACT NA SUSP: 1.0000 | Freq: Every day | NASAL | 3 refills | Status: DC

## 2021-01-19 MED ORDER — VALSARTAN 160 MG PO TABS: ORAL_TABLET | ORAL | 0 refills | Status: DC

## 2021-01-19 MED ORDER — CARVEDILOL 25 MG PO TABS: 25.0000 mg | ORAL_TABLET | Freq: Two times a day (BID) | ORAL | 0 refills | Status: DC

## 2021-01-19 NOTE — Telephone Encounter (Signed)
E-scribed refills.  

## 2021-01-19 NOTE — Telephone Encounter (Signed)
Pt called in wanted to get a refill on his medications, all meds .

## 2021-03-18 ENCOUNTER — Other Ambulatory Visit: Payer: Self-pay | Admitting: Family Medicine

## 2021-03-18 DIAGNOSIS — E1122 Type 2 diabetes mellitus with diabetic chronic kidney disease: Secondary | ICD-10-CM

## 2021-03-18 DIAGNOSIS — E1169 Type 2 diabetes mellitus with other specified complication: Secondary | ICD-10-CM

## 2021-03-18 DIAGNOSIS — IMO0002 Reserved for concepts with insufficient information to code with codable children: Secondary | ICD-10-CM

## 2021-03-18 DIAGNOSIS — N183 Chronic kidney disease, stage 3 unspecified: Secondary | ICD-10-CM

## 2021-03-18 DIAGNOSIS — Z125 Encounter for screening for malignant neoplasm of prostate: Secondary | ICD-10-CM

## 2021-03-18 DIAGNOSIS — E1121 Type 2 diabetes mellitus with diabetic nephropathy: Secondary | ICD-10-CM

## 2021-03-20 ENCOUNTER — Other Ambulatory Visit: Payer: Self-pay

## 2021-03-20 ENCOUNTER — Other Ambulatory Visit (INDEPENDENT_AMBULATORY_CARE_PROVIDER_SITE_OTHER): Payer: Self-pay

## 2021-03-20 DIAGNOSIS — N183 Chronic kidney disease, stage 3 unspecified: Secondary | ICD-10-CM

## 2021-03-20 DIAGNOSIS — Z125 Encounter for screening for malignant neoplasm of prostate: Secondary | ICD-10-CM

## 2021-03-20 DIAGNOSIS — E1122 Type 2 diabetes mellitus with diabetic chronic kidney disease: Secondary | ICD-10-CM

## 2021-03-20 DIAGNOSIS — E1165 Type 2 diabetes mellitus with hyperglycemia: Secondary | ICD-10-CM

## 2021-03-20 DIAGNOSIS — E785 Hyperlipidemia, unspecified: Secondary | ICD-10-CM

## 2021-03-20 DIAGNOSIS — E1121 Type 2 diabetes mellitus with diabetic nephropathy: Secondary | ICD-10-CM

## 2021-03-20 DIAGNOSIS — Z794 Long term (current) use of insulin: Secondary | ICD-10-CM

## 2021-03-20 DIAGNOSIS — IMO0002 Reserved for concepts with insufficient information to code with codable children: Secondary | ICD-10-CM

## 2021-03-20 DIAGNOSIS — E1169 Type 2 diabetes mellitus with other specified complication: Secondary | ICD-10-CM

## 2021-03-20 LAB — COMPREHENSIVE METABOLIC PANEL
ALT: 20 U/L (ref 0–53)
AST: 17 U/L (ref 0–37)
Albumin: 3.8 g/dL (ref 3.5–5.2)
Alkaline Phosphatase: 67 U/L (ref 39–117)
BUN: 37 mg/dL — ABNORMAL HIGH (ref 6–23)
CO2: 24 mEq/L (ref 19–32)
Calcium: 9.4 mg/dL (ref 8.4–10.5)
Chloride: 106 mEq/L (ref 96–112)
Creatinine, Ser: 2.5 mg/dL — ABNORMAL HIGH (ref 0.40–1.50)
GFR: 29.97 mL/min — ABNORMAL LOW (ref >60.00)
Glucose, Bld: 272 mg/dL — ABNORMAL HIGH (ref 70–99)
Potassium: 4.5 mEq/L (ref 3.5–5.1)
Sodium: 138 mEq/L (ref 135–145)
Total Bilirubin: 0.7 mg/dL (ref 0.2–1.2)
Total Protein: 6.8 g/dL (ref 6.0–8.3)

## 2021-03-20 LAB — CBC WITH DIFFERENTIAL/PLATELET
Basophils Absolute: 0 10*3/uL (ref 0.0–0.1)
Basophils Relative: 0.7 % (ref 0.0–3.0)
Eosinophils Absolute: 0.2 10*3/uL (ref 0.0–0.7)
Eosinophils Relative: 4 % (ref 0.0–5.0)
HCT: 39.9 % (ref 39.0–52.0)
Hemoglobin: 13 g/dL (ref 13.0–17.0)
Lymphocytes Relative: 27 % (ref 12.0–46.0)
Lymphs Abs: 1.4 10*3/uL (ref 0.7–4.0)
MCHC: 32.7 g/dL (ref 30.0–36.0)
MCV: 80.7 fl (ref 78.0–100.0)
Monocytes Absolute: 0.4 10*3/uL (ref 0.1–1.0)
Monocytes Relative: 7.8 % (ref 3.0–12.0)
Neutro Abs: 3 10*3/uL (ref 1.4–7.7)
Neutrophils Relative %: 60.5 % (ref 43.0–77.0)
Platelets: 129 10*3/uL — ABNORMAL LOW (ref 150.0–400.0)
RBC: 4.94 Mil/uL (ref 4.22–5.81)
RDW: 13.5 % (ref 11.5–15.5)
WBC: 5 10*3/uL (ref 4.0–10.5)

## 2021-03-20 LAB — VITAMIN D 25 HYDROXY (VIT D DEFICIENCY, FRACTURES): VITD: 29.51 ng/mL — ABNORMAL LOW (ref 30.00–100.00)

## 2021-03-20 LAB — LIPID PANEL
Cholesterol: 135 mg/dL (ref 0–200)
HDL: 30.3 mg/dL — ABNORMAL LOW (ref >39.00)
NonHDL: 104.64
Total CHOL/HDL Ratio: 4
Triglycerides: 228 mg/dL — ABNORMAL HIGH (ref 0.0–149.0)
VLDL: 45.6 mg/dL — ABNORMAL HIGH (ref 0.0–40.0)

## 2021-03-20 LAB — LDL CHOLESTEROL, DIRECT: Direct LDL: 56 mg/dL

## 2021-03-20 LAB — PSA: PSA: 1.22 ng/mL (ref 0.10–4.00)

## 2021-03-20 LAB — HEMOGLOBIN A1C: Hgb A1c MFr Bld: 9.2 % — ABNORMAL HIGH (ref 4.6–6.5)

## 2021-03-21 LAB — PARATHYROID HORMONE, INTACT (NO CA): PTH: 78 pg/mL — ABNORMAL HIGH (ref 16–77)

## 2021-03-28 ENCOUNTER — Encounter: Payer: Self-pay | Admitting: Family Medicine

## 2021-03-28 ENCOUNTER — Ambulatory Visit (INDEPENDENT_AMBULATORY_CARE_PROVIDER_SITE_OTHER): Payer: BC Managed Care – PPO | Admitting: Family Medicine

## 2021-03-28 ENCOUNTER — Other Ambulatory Visit: Payer: Self-pay

## 2021-03-28 VITALS — BP 138/90 | HR 87 | Temp 97.7°F | Ht 74.0 in | Wt 242.1 lb

## 2021-03-28 DIAGNOSIS — E1122 Type 2 diabetes mellitus with diabetic chronic kidney disease: Secondary | ICD-10-CM

## 2021-03-28 DIAGNOSIS — Z0001 Encounter for general adult medical examination with abnormal findings: Secondary | ICD-10-CM

## 2021-03-28 DIAGNOSIS — E785 Hyperlipidemia, unspecified: Secondary | ICD-10-CM

## 2021-03-28 DIAGNOSIS — N183 Chronic kidney disease, stage 3 unspecified: Secondary | ICD-10-CM

## 2021-03-28 DIAGNOSIS — I10 Essential (primary) hypertension: Secondary | ICD-10-CM

## 2021-03-28 DIAGNOSIS — E1165 Type 2 diabetes mellitus with hyperglycemia: Secondary | ICD-10-CM

## 2021-03-28 DIAGNOSIS — E669 Obesity, unspecified: Secondary | ICD-10-CM

## 2021-03-28 DIAGNOSIS — Z1211 Encounter for screening for malignant neoplasm of colon: Secondary | ICD-10-CM

## 2021-03-28 DIAGNOSIS — E1169 Type 2 diabetes mellitus with other specified complication: Secondary | ICD-10-CM | POA: Diagnosis not present

## 2021-03-28 DIAGNOSIS — E1121 Type 2 diabetes mellitus with diabetic nephropathy: Secondary | ICD-10-CM

## 2021-03-28 DIAGNOSIS — Z794 Long term (current) use of insulin: Secondary | ICD-10-CM

## 2021-03-28 DIAGNOSIS — IMO0002 Reserved for concepts with insufficient information to code with codable children: Secondary | ICD-10-CM

## 2021-03-28 MED ORDER — VALSARTAN 160 MG PO TABS: 160.0000 mg | ORAL_TABLET | Freq: Every day | ORAL | 3 refills | Status: DC

## 2021-03-28 MED ORDER — CARVEDILOL 25 MG PO TABS: 25.0000 mg | ORAL_TABLET | Freq: Two times a day (BID) | ORAL | 3 refills | Status: DC

## 2021-03-28 MED ORDER — OZEMPIC (0.25 OR 0.5 MG/DOSE) 2 MG/1.5ML ~~LOC~~ SOPN: PEN_INJECTOR | SUBCUTANEOUS | 1 refills | Status: AC

## 2021-03-28 MED ORDER — VITAMIN D 50 MCG (2000 UT) PO CAPS: 1.0000 | ORAL_CAPSULE | Freq: Every day | ORAL | Status: AC

## 2021-03-28 MED ORDER — SIMVASTATIN 20 MG PO TABS: 20.0000 mg | ORAL_TABLET | Freq: Every day | ORAL | 3 refills | Status: DC

## 2021-03-28 NOTE — Patient Instructions (Addendum)
Pass by lab to pick up stool kit.  We will request latest eye exam from [facility] with [facility] in [place] ([place]).  Start ozempic 0.$RemoveBeforeD'25mg'aNZMhgNQWsaNyI$  weekly for 2 weeks then increase to 0.$RemoveBefor'5mg'QFDnqQDREiaI$  weekly. This will replace januvia.  Continue other medicines. Increase vit D to 2000 units daily.  Return in 6 weeks for diabetes follow up visit, bring in fasting sugar log of 1-2 weeks prior.  We will order renal artery ultrasound.   Health Maintenance, Male Adopting a healthy lifestyle and getting preventive care are important in promoting health and wellness. Ask your health care provider about:  The right schedule for you to have regular tests and exams.  Things you can do on your own to prevent diseases and keep yourself healthy. What should I know about diet, weight, and exercise? Eat a healthy diet  Eat a diet that includes plenty of vegetables, fruits, low-fat dairy products, and lean protein.  Do not eat a lot of foods that are high in solid fats, added sugars, or sodium.   Maintain a healthy weight Body mass index (BMI) is a measurement that can be used to identify possible weight problems. It estimates body fat based on height and weight. Your health care provider can help determine your BMI and help you achieve or maintain a healthy weight. Get regular exercise Get regular exercise. This is one of the most important things you can do for your health. Most adults should:  Exercise for at least 150 minutes each week. The exercise should increase your heart rate and make you sweat (moderate-intensity exercise).  Do strengthening exercises at least twice a week. This is in addition to the moderate-intensity exercise.  Spend less time sitting. Even light physical activity can be beneficial. Watch cholesterol and blood lipids Have your blood tested for lipids and cholesterol at 46 years of age, then have this test every 5 years. You may need to have your cholesterol levels  checked more often if:  Your lipid or cholesterol levels are high.  You are older than 47 years of age.  You are at high risk for heart disease. What should I know about cancer screening? Many types of cancers can be detected early and may often be prevented. Depending on your health history and family history, you may need to have cancer screening at various ages. This may include screening for:  Colorectal cancer.  Prostate cancer.  Skin cancer.  Lung cancer. What should I know about heart disease, diabetes, and high blood pressure? Blood pressure and heart disease  High blood pressure causes heart disease and increases the risk of stroke. This is more likely to develop in people who have high blood pressure readings, are of African descent, or are overweight.  Talk with your health care provider about your target blood pressure readings.  Have your blood pressure checked: ? Every 3-5 years if you are 79-51 years of age. ? Every year if you are 39 years old or older.  If you are between the ages of 63 and 66 and are a current or former smoker, ask your health care provider if you should have a one-time screening for abdominal aortic aneurysm (AAA). Diabetes Have regular diabetes screenings. This checks your fasting blood sugar level. Have the screening done:  Once every three years after age 6 if you are at a normal weight and have a low risk for diabetes.  More often and at a younger age if you are overweight or  have a high risk for diabetes. What should I know about preventing infection? Hepatitis B If you have a higher risk for hepatitis B, you should be screened for this virus. Talk with your health care provider to find out if you are at risk for hepatitis B infection. Hepatitis C Blood testing is recommended for:  Everyone born from [birth year] through 1967.  Anyone with known risk factors for hepatitis C. Sexually transmitted infections (STIs)  You should be screened  each year for STIs, including gonorrhea and chlamydia, if: ? You are sexually active and are younger than 47 years of age. ? You are older than 47 years of age and your health care provider tells you that you are at risk for this type of infection. ? Your sexual activity has changed since you were last screened, and you are at increased risk for chlamydia or gonorrhea. Ask your health care provider if you are at risk.  Ask your health care provider about whether you are at high risk for HIV. Your health care provider may recommend a prescription medicine to help prevent HIV infection. If you choose to take medicine to prevent HIV, you should first get tested for HIV. You should then be tested every 3 months for as long as you are taking the medicine. Follow these instructions at home: Lifestyle  Do not use any products that contain nicotine or tobacco, such as cigarettes, e-cigarettes, and chewing tobacco. If you need help quitting, ask your health care provider.  Do not use street drugs.  Do not share needles.  Ask your health care provider for help if you need support or information about quitting drugs. Alcohol use  Do not drink alcohol if your health care provider tells you not to drink.  If you drink alcohol: ? Limit how much you have to 0-2 drinks a day. ? Be aware of how much alcohol is in your drink. In the [place] one drink equals one 12 oz bottle of beer (355 mL), one 5 oz glass of wine (148 mL), or one 1 oz glass of hard liquor (44 mL). General instructions  Schedule regular health, dental, and eye exams.  Stay current with your vaccines.  Tell your health care provider if: ? You often feel depressed. ? You have ever been abused or do not feel safe at home. Summary  Adopting a healthy lifestyle and getting preventive care are important in promoting health and wellness.  Follow your health care provider's instructions about healthy diet, exercising, and getting tested or  screened for diseases.  Follow your health care provider's instructions on monitoring your cholesterol and blood pressure. This information is not intended to replace advice given to you by your health care provider. Make sure you discuss any questions you have with your health care provider. Document Revised: 11/12/2018 Document Reviewed: 11/12/2018 Elsevier Patient Education  2021 [employer].

## 2021-03-28 NOTE — Assessment & Plan Note (Addendum)
Chronic, a bit high despite valsartan and carvedilol. No regimen changes made today.

## 2021-03-28 NOTE — Assessment & Plan Note (Signed)
Chronic, stable on statin - continue. The 10-year ASCVD risk score Denman George DC Montez Hageman., et al., 2013) is: 15.7%   Values used to calculate the score:     Age: 45 years     Sex: Male     Is Non-Hispanic African American: Yes     Diabetic: Yes     Tobacco smoker: No     Systolic Blood Pressure: 138 mmHg     Is BP treated: Yes     HDL Cholesterol: 30.3 mg/dL     Total Cholesterol: 135 mg/dL

## 2021-03-28 NOTE — Assessment & Plan Note (Signed)
Preventative protocols reviewed and updated unless pt declined. Discussed healthy diet and lifestyle.  

## 2021-03-28 NOTE — Assessment & Plan Note (Signed)
Chronic, uncontrolled.  Did not start lantus last visit (11/2019).  Agrees to trial ozempic in place of [place].  RTC 6 wks with sugar log with planned further titration at that time.  Avoid metformin, SGLT2 in CKD.

## 2021-03-28 NOTE — Progress Notes (Signed)
Patient ID: Rodney Ortiz, male    DOB: [birth year], 45 y.o.   MRN: [id number]  This visit was conducted in person.  BP 138/90   Pulse 87   Temp 97.7 F (36.5 C) (Temporal)   Ht 6' 2" (1.88 m)   Wt 242 lb 1 oz (109.8 kg)   SpO2 99%   BMI 31.08 kg/m    CC: CPE Subjective:   HPI: Rodney Ortiz is a 45 y.o. male presenting on 03/28/2021 for Annual Exam   HTN - on valsartan 60m daily, carvedilol 266mbid. Has not been checking BP at home.  HLD - on simvastatin 2048maily.  DM - on januvia 100m67mily. Did not try lantus. Poor diet choices. Sugars running high. Did not tolerate metformin due to GI upset, we also were avoiding in h/o CKD. No fmhx thyroid cancer. DM eye exam 11/2020 - records requested.  COVID illness 09/2020 - did fully recover  Preventative: Colon cancer screening - would like to continue iFOB.  Prostate cancer screening - no known fmhx prostate cancer. Consider checking PSA next labs.  Flu shot yearly  COVID vaccine 01/2020, 02/2020  Tdap 2015  Pneumovax 2015  Seat belt use discussed  Sunscreen use discussed.No changing moles on skin  Non smoker  Alcohol - none Dentist - hasn't seen recently  Eye exam - sees yearly. 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 Interim medical history since our last visit reviewed. Allergies and medications reviewed and updated. Outpatient Medications Prior to Visit  Medication Sig Dispense Refill  . Blood Glucose Monitoring Suppl (ONE TOUCH ULTRA MINI) w/Device KIT 1 kit by Does not apply route as directed. 1 each 0  . fluticasone (FLONASE) 50 MCG/ACT nasal spray Place 1 spray into both nostrils daily. 16 g 3  . glucose blood (ONE TOUCH ULTRA TEST) test strip Use as  instructed to check sugars daily and as needed 100 each 3  . Insulin Pen Needle 33G X 6 MM MISC 1 Units by Does not apply route as directed. Use with basaglar pen for daily insulin shot 100 each 3  . ONETOUCH DELICA LANCETS 33G 63OC Check blood sugar daily and as directed or needed. 100 each 2  . carvedilol (COREG) 25 MG tablet Take 1 tablet (25 mg total) by mouth 2 (two) times daily with a meal. 180 tablet 0  . cholecalciferol (VITAMIN D) 1000 units tablet Take 1,000 Units by mouth daily.    . Insulin Glargine (BASAGLAR KWIKPEN) 100 UNIT/ML Inject 20 Units into the skin daily. 6 mL 3  . simvastatin (ZOCOR) 20 MG tablet Take 1 tablet (20 mg total) by mouth at bedtime. 90 tablet 0  . sitaGLIPtin (JANUVIA) 100 MG tablet Take 1 tablet (100 mg total) by mouth daily. 90 tablet 0  . valsartan (DIOVAN) 160 MG tablet TAKE 1 TABLET DAILY (NEW   DOSE) 90 tablet 0   No facility-administered medications prior to visit.     Per HPI unless specifically indicated in ROS section below Review of Systems  Constitutional: Negative for activity change, appetite change, chills, fatigue, fever and unexpected weight change.  HENT: Negative for hearing loss.   Eyes: Negative for visual disturbance.  Respiratory: Positive for cough. Negative for chest tightness, shortness of breath  and wheezing.   Cardiovascular: Negative for chest pain, palpitations and leg swelling.  Gastrointestinal: Negative for abdominal distention, abdominal pain, blood in stool, constipation, diarrhea, nausea and vomiting.  Genitourinary: Negative for difficulty urinating and hematuria.  Musculoskeletal: Negative for arthralgias, myalgias and neck pain.  Skin: Negative for rash.  Neurological: Negative for dizziness, seizures, syncope and headaches.  Hematological: Negative for adenopathy. Does not bruise/bleed easily.  Psychiatric/Behavioral: Negative for dysphoric mood. The patient is not nervous/anxious.    Objective:  BP 138/90    Pulse 87   Temp 97.7 F (36.5 C) (Temporal)   Ht 6' 2" (1.88 m)   Wt 242 lb 1 oz (109.8 kg)   SpO2 99%   BMI 31.08 kg/m   Wt Readings from Last 3 Encounters:  03/28/21 242 lb 1 oz (109.8 kg)  11/23/19 248 lb 2 oz (112.5 kg)  08/04/19 254 lb 6 oz (115.4 kg)      Physical Exam Vitals and nursing note reviewed.  Constitutional:      General: He is not in acute distress.    Appearance: Normal appearance. He is well-developed. He is not ill-appearing.  HENT:     Head: Normocephalic and atraumatic.     Right Ear: Hearing, tympanic membrane, ear canal and external ear normal.     Left Ear: Hearing, tympanic membrane, ear canal and external ear normal.  Eyes:     General: No scleral icterus.    Extraocular Movements: Extraocular movements intact.     Conjunctiva/sclera: Conjunctivae normal.     Pupils: Pupils are equal, round, and reactive to light.  Neck:     Thyroid: No thyroid mass or thyromegaly.  Cardiovascular:     Rate and Rhythm: Normal rate and regular rhythm.     Pulses: Normal pulses.          Radial pulses are 2+ on the right side and 2+ on the left side.     Heart sounds: Normal heart sounds. No murmur heard.   Pulmonary:     Effort: Pulmonary effort is normal. No respiratory distress.     Breath sounds: Normal breath sounds. No wheezing, rhonchi or rales.  Abdominal:     General: Bowel sounds are normal. There is no distension.     Palpations: Abdomen is soft. There is no mass.     Tenderness: There is no abdominal tenderness. There is no guarding or rebound.     Hernia: No hernia is present.  Musculoskeletal:        General: Normal range of motion.     Cervical back: Normal range of motion and neck supple.     Right lower leg: No edema.     Left lower leg: No edema.  Lymphadenopathy:     Cervical: No cervical adenopathy.  Skin:    General: Skin is warm and dry.     Findings: No rash.  Neurological:     General: No focal deficit present.     Mental  Status: He is alert and oriented to person, place, and time.     Comments: CN grossly intact, station and gait intact  Psychiatric:        Mood and Affect: Mood normal.        Behavior: Behavior normal.        Thought Content: Thought content normal.        Judgment: Judgment normal.       Results for orders placed or performed in visit on 03/20/21  Parathyroid  hormone, intact (no Ca)  Result Value Ref Range   PTH 78 (H) 16 - 77 pg/mL  VITAMIN D 25 Hydroxy (Vit-D Deficiency, Fractures)  Result Value Ref Range   VITD 29.51 (L) 30.00 - 100.00 ng/mL  CBC with Differential/Platelet  Result Value Ref Range   WBC 5.0 4.0 - 10.5 K/uL   RBC 4.94 4.22 - 5.81 Mil/uL   Hemoglobin 13.0 13.0 - 17.0 g/dL   HCT 39.9 39.0 - 52.0 %   MCV 80.7 78.0 - 100.0 fl   MCHC 32.7 30.0 - 36.0 g/dL   RDW 13.5 11.5 - 15.5 %   Platelets 129.0 (L) 150.0 - 400.0 K/uL   Neutrophils Relative % 60.5 43.0 - 77.0 %   Lymphocytes Relative 27.0 12.0 - 46.0 %   Monocytes Relative 7.8 3.0 - 12.0 %   Eosinophils Relative 4.0 0.0 - 5.0 %   Basophils Relative 0.7 0.0 - 3.0 %   Neutro Abs 3.0 1.4 - 7.7 K/uL   Lymphs Abs 1.4 0.7 - 4.0 K/uL   Monocytes Absolute 0.4 0.1 - 1.0 K/uL   Eosinophils Absolute 0.2 0.0 - 0.7 K/uL   Basophils Absolute 0.0 0.0 - 0.1 K/uL  PSA  Result Value Ref Range   PSA 1.22 0.10 - 4.00 ng/mL  Hemoglobin A1c  Result Value Ref Range   Hgb A1c MFr Bld 9.2 (H) 4.6 - 6.5 %  Comprehensive metabolic panel  Result Value Ref Range   Sodium 138 135 - 145 mEq/L   Potassium 4.5 3.5 - 5.1 mEq/L   Chloride 106 96 - 112 mEq/L   CO2 24 19 - 32 mEq/L   Glucose, Bld 272 (H) 70 - 99 mg/dL   BUN 37 (H) 6 - 23 mg/dL   Creatinine, Ser 2.50 (H) 0.40 - 1.50 mg/dL   Total Bilirubin 0.7 0.2 - 1.2 mg/dL   Alkaline Phosphatase 67 39 - 117 U/L   AST 17 0 - 37 U/L   ALT 20 0 - 53 U/L   Total Protein 6.8 6.0 - 8.3 g/dL   Albumin 3.8 3.5 - 5.2 g/dL   GFR 29.97 (L) >60.00 mL/min   Calcium 9.4 8.4 - 10.5 mg/dL   Lipid panel  Result Value Ref Range   Cholesterol 135 0 - 200 mg/dL   Triglycerides 228.0 (H) 0.0 - 149.0 mg/dL   HDL 30.30 (L) >39.00 mg/dL   VLDL 45.6 (H) 0.0 - 40.0 mg/dL   Total CHOL/HDL Ratio 4    NonHDL 104.64   LDL cholesterol, direct  Result Value Ref Range   Direct LDL 56.0 mg/dL   US Abdomen Complete CLINICAL DATA:  Diabetes, weight loss  EXAM: ABDOMEN ULTRASOUND COMPLETE  COMPARISON:  None.  FINDINGS: Gallbladder: Gallbladder is contracted. No visible stones or wall thickening. Negative sonographic Murphy's.  Common bile duct: Diameter: Normal caliber, 4 mm  Liver: Increased echotexture compatible with fatty infiltration. No focal abnormality or biliary ductal dilatation. Portal vein is patent on color Doppler imaging with normal direction of blood flow towards the liver.  IVC: No abnormality visualized.  Pancreas: Visualized portion unremarkable.  Spleen: Size and appearance within normal limits.  Right Kidney: Length: 10.2 cm. 1.3 cm cyst in the midpole. Increased echotexture. No hydronephrosis.  Left Kidney: Length: 12.2 cm. 3.9 cm simple appearing upper pole cyst. Increased echotexture. No hydronephrosis.  Abdominal aorta: No aneurysm visualized.  Other findings: None.  IMPRESSION: Fatty infiltration of the liver.  Contracted gallbladder. No stones or evidence of acute cholecystitis.  Increased echotexture within the kidneys bilaterally compatible with chronic medical renal disease. No hydronephrosis.  Electronically Signed   By: Rolm Baptise M.D.   On: 11/25/2019 08:31   Assessment & Plan:  This visit occurred during the SARS-CoV-2 public health emergency.  Safety protocols were in place, including screening questions prior to the visit, additional usage of staff PPE, and extensive cleaning of exam room while observing appropriate contact time as indicated for disinfecting solutions.   Problem List Items Addressed This Visit     Hyperlipidemia associated with type 2 diabetes mellitus ([place])    Chronic, stable on statin - continue. The 10-year ASCVD risk score Mikey Bussing DC Brooke Bonito., et al., 2013) is: 15.7%   Values used to calculate the score:     Age: 20 years     Sex: Male     Is Non-Hispanic African American: Yes     Diabetic: Yes     Tobacco smoker: No     Systolic Blood Pressure: 174 mmHg     Is BP treated: Yes     HDL Cholesterol: 30.3 mg/dL     Total Cholesterol: 135 mg/dL       Relevant Medications   Semaglutide,0.25 or 0.5MG/DOS, (OZEMPIC, 0.25 OR 0.5 MG/DOSE,) 2 MG/1.5ML SOPN   simvastatin (ZOCOR) 20 MG tablet   valsartan (DIOVAN) 160 MG tablet   Essential hypertension    Chronic, a bit high despite valsartan and carvedilol. No regimen changes made today.       Relevant Medications   carvedilol (COREG) 25 MG tablet   simvastatin (ZOCOR) 20 MG tablet   valsartan (DIOVAN) 160 MG tablet   Other Relevant Orders   VAS US RENAL ARTERY DUPLEX   CKD stage 3 due to type 2 diabetes mellitus (HCC)    Chronic, deteriorated with GFR down to 29. In setting of increasing ARB. Did not return for f/u. Will check renal artery [place] r/o RAS. Discussed importance of better sugar control to prevent further kidney deterioration.       Relevant Medications   Semaglutide,0.25 or 0.5MG/DOS, (OZEMPIC, 0.25 OR 0.5 MG/DOSE,) 2 MG/1.5ML SOPN   simvastatin (ZOCOR) 20 MG tablet   valsartan (DIOVAN) 160 MG tablet   Encounter for routine adult health examination with abnormal findings - Primary    Preventative protocols reviewed and updated unless pt declined. Discussed healthy diet and lifestyle.       Uncontrolled diabetes mellitus with diabetic nephropathy, with long-term current use of insulin (HCC)    Chronic, uncontrolled.  Did not start lantus last visit (11/2019).  Agrees to trial ozempic in place of [place].  RTC 6 wks with sugar log with planned further titration at that time.  Avoid metformin, SGLT2 in CKD.        Relevant Medications   Semaglutide,0.25 or 0.5MG/DOS, (OZEMPIC, 0.25 OR 0.5 MG/DOSE,) 2 MG/1.5ML SOPN   simvastatin (ZOCOR) 20 MG tablet   valsartan (DIOVAN) 160 MG tablet   Obesity, Class I, BMI 30-34.9    Other Visit Diagnoses    Special screening for malignant neoplasms, colon       Relevant Orders   Fecal occult blood, imunochemical       Meds ordered this encounter  Medications  . Semaglutide,0.25 or 0.5MG/DOS, (OZEMPIC, 0.25 OR 0.5 MG/DOSE,) 2 MG/1.5ML SOPN    Sig: Inject 0.25 mg into the skin once a week for 14 days, THEN 0.5 mg once a week.    Dispense:  1.5 mL    Refill:  1  .  carvedilol (COREG) 25 MG tablet    Sig: Take 1 tablet (25 mg total) by mouth 2 (two) times daily with a meal.    Dispense:  180 tablet    Refill:  3  . simvastatin (ZOCOR) 20 MG tablet    Sig: Take 1 tablet (20 mg total) by mouth at bedtime.    Dispense:  90 tablet    Refill:  3  . valsartan (DIOVAN) 160 MG tablet    Sig: Take 1 tablet (160 mg total) by mouth daily.    Dispense:  90 tablet    Refill:  3  . Cholecalciferol (VITAMIN D) 50 MCG (2000 UT) CAPS    Sig: Take 1 capsule (2,000 Units total) by mouth daily.    Dispense:  30 capsule   Orders Placed This Encounter  Procedures  . Fecal occult blood, imunochemical    Standing Status:   Future    Standing Expiration Date:   03/28/2022    Patient instructions: Pass by lab to pick up stool kit.  We will request latest eye exam from [facility] Minors with [facility] in [place]  Start ozempic 0.58m weekly for 2 weeks then increase to 0.567mweekly. This will replace januvia.  Continue other medicines. Increase vit D to 2000 units daily.  Return in 6 weeks for diabetes follow up visit, bring in fasting sugar log of 1-2 weeks prior.   Follow up plan: Return in about 6 weeks (around 05/09/2021) for follow up visit.  JaRia BushMD

## 2021-03-28 NOTE — Assessment & Plan Note (Addendum)
Chronic, deteriorated with GFR down to 29. In setting of increasing ARB. Did not return for f/u. Will check renal artery [place] r/o RAS. Discussed importance of better sugar control to prevent further kidney deterioration.

## 2021-03-30 ENCOUNTER — Other Ambulatory Visit: Payer: Self-pay | Admitting: Family Medicine

## 2021-03-30 DIAGNOSIS — N183 Chronic kidney disease, stage 3 unspecified: Secondary | ICD-10-CM

## 2021-03-30 DIAGNOSIS — I10 Essential (primary) hypertension: Secondary | ICD-10-CM

## 2021-03-30 DIAGNOSIS — N179 Acute kidney failure, unspecified: Secondary | ICD-10-CM

## 2021-03-30 NOTE — Progress Notes (Signed)
Please fix the Renal Artery [place] order Needs to be [place] not VAS  Thanks.   Location [facility]

## 2021-03-30 NOTE — Progress Notes (Signed)
New imaging study ordered.

## 2021-04-17 ENCOUNTER — Ambulatory Visit (HOSPITAL_BASED_OUTPATIENT_CLINIC_OR_DEPARTMENT_OTHER): Payer: BC Managed Care – PPO

## 2021-05-10 ENCOUNTER — Other Ambulatory Visit (HOSPITAL_BASED_OUTPATIENT_CLINIC_OR_DEPARTMENT_OTHER): Payer: Self-pay | Admitting: Family Medicine

## 2021-05-10 DIAGNOSIS — N23 Unspecified renal colic: Secondary | ICD-10-CM

## 2021-05-15 ENCOUNTER — Ambulatory Visit: Payer: BC Managed Care – PPO | Admitting: Family Medicine

## 2021-05-16 ENCOUNTER — Other Ambulatory Visit (INDEPENDENT_AMBULATORY_CARE_PROVIDER_SITE_OTHER): Payer: BC Managed Care – PPO

## 2021-05-16 DIAGNOSIS — Z1211 Encounter for screening for malignant neoplasm of colon: Secondary | ICD-10-CM

## 2021-05-16 LAB — FECAL OCCULT BLOOD, GUAIAC: Fecal Occult Blood: NEGATIVE

## 2021-05-16 LAB — FECAL OCCULT BLOOD, IMMUNOCHEMICAL: Fecal Occult Bld: NEGATIVE

## 2021-05-17 ENCOUNTER — Other Ambulatory Visit: Payer: Self-pay

## 2021-05-17 ENCOUNTER — Ambulatory Visit (HOSPITAL_BASED_OUTPATIENT_CLINIC_OR_DEPARTMENT_OTHER)
Admission: RE | Admit: 2021-05-17 | Discharge: 2021-05-17 | Disposition: A | Discharge status: Home / Self Care | Payer: BC Managed Care – PPO | Source: Ambulatory Visit | Attending: Family Medicine | Admitting: Family Medicine

## 2021-05-17 ENCOUNTER — Encounter: Payer: Self-pay | Admitting: Family Medicine

## 2021-05-17 DIAGNOSIS — N23 Unspecified renal colic: Secondary | ICD-10-CM | POA: Insufficient documentation

## 2021-06-19 ENCOUNTER — Encounter: Payer: Self-pay | Admitting: Family Medicine

## 2021-06-19 ENCOUNTER — Ambulatory Visit (INDEPENDENT_AMBULATORY_CARE_PROVIDER_SITE_OTHER): Payer: BC Managed Care – PPO | Admitting: Family Medicine

## 2021-06-19 ENCOUNTER — Other Ambulatory Visit: Payer: Self-pay

## 2021-06-19 VITALS — BP 138/90 | HR 90 | Temp 97.4°F | Ht 74.0 in | Wt 240.1 lb

## 2021-06-19 DIAGNOSIS — N183 Chronic kidney disease, stage 3 unspecified: Secondary | ICD-10-CM

## 2021-06-19 DIAGNOSIS — E1122 Type 2 diabetes mellitus with diabetic chronic kidney disease: Secondary | ICD-10-CM | POA: Diagnosis not present

## 2021-06-19 DIAGNOSIS — E1121 Type 2 diabetes mellitus with diabetic nephropathy: Secondary | ICD-10-CM | POA: Diagnosis not present

## 2021-06-19 DIAGNOSIS — Z794 Long term (current) use of insulin: Secondary | ICD-10-CM | POA: Diagnosis not present

## 2021-06-19 DIAGNOSIS — IMO0002 Reserved for concepts with insufficient information to code with codable children: Secondary | ICD-10-CM

## 2021-06-19 DIAGNOSIS — E1165 Type 2 diabetes mellitus with hyperglycemia: Secondary | ICD-10-CM

## 2021-06-19 DIAGNOSIS — I10 Essential (primary) hypertension: Secondary | ICD-10-CM | POA: Diagnosis not present

## 2021-06-19 LAB — POCT GLYCOSYLATED HEMOGLOBIN (HGB A1C): Hemoglobin A1C: 11 % — AB (ref 4.0–5.6)

## 2021-06-19 MED ORDER — OZEMPIC (0.25 OR 0.5 MG/DOSE) 2 MG/1.5ML ~~LOC~~ SOPN: 0.5000 mg | PEN_INJECTOR | SUBCUTANEOUS | 11 refills | Status: DC

## 2021-06-19 MED ORDER — VALSARTAN 320 MG PO TABS: 320.0000 mg | ORAL_TABLET | Freq: Every day | ORAL | 1 refills | Status: DC

## 2021-06-19 MED ORDER — BASAGLAR KWIKPEN 100 UNIT/ML ~~LOC~~ SOPN: 10.0000 [IU] | PEN_INJECTOR | Freq: Every day | SUBCUTANEOUS | 1 refills | Status: DC

## 2021-06-19 NOTE — Assessment & Plan Note (Signed)
Chronic, deteriorated.  Only on ozempic 0.25mg  weekly - will increase to 0.5mg  weekly. Will start basal insulin. Discussed titration schedule.  RTC 6 wks f/u visit.

## 2021-06-19 NOTE — Assessment & Plan Note (Signed)
Continue to increase valsartan to 320mg  daily. RTC 1 wk labs only.  Discussed relation of uncontrolled diabetes to deterioration in kidney function.

## 2021-06-19 NOTE — Assessment & Plan Note (Signed)
Chronic, above goal. Will continue to increase valsartan to 320mg  daily. Continue full dose carvedilol.

## 2021-06-19 NOTE — Progress Notes (Signed)
Patient ID: Rodney Ortiz, male    DOB: 30-Apr-1976, 45 y.o.   MRN: [id number]  This visit was conducted in person.  BP 138/90   Pulse 90   Temp (!) 97.4 F (36.3 C) (Temporal)   Ht 6' 2" (1.88 m)   Wt 240 lb 1 oz (108.9 kg)   SpO2 98%   BMI 30.82 kg/m   On repeat 138/100  CC: DM f/u visit  Subjective:   HPI: Rodney Ortiz is a 45 y.o. male presenting on 06/19/2021 for Diabetes (Here for 6 wk f/u.)   Recent renal artery [place] returned reassuringly normal.   HTN - Compliant with current antihypertensive regimen of valsartan 146m and carvedilol 270mbid. Does check blood pressures at home: 130-160/80-90. No low blood pressure readings or symptoms of dizziness/syncope. Denies HA, vision changes, CP/tightness, SOB, leg swelling.   DM - does regularly check sugars a few times a week - to 300s. Compliant with antihyperglycemic regimen which includes: januvia 10057maily - last visit transitioned to ozempic. He started ozempic a few weeks ago 0.16m29mid not tolerate metformin. Denies low sugars or hypoglycemic symptoms. Denies paresthesias. Last diabetic eye exam 11/2020. Pneumovax: 2015. Prevnar: consider. Glucometer brand: onetouch. DSME: 2018.  Lab Results  Component Value Date   HGBA1C 11.0 (A) 06/19/2021   Diabetic Foot Exam - Simple   Simple Foot Form Diabetic Foot exam was performed with the following findings: Yes 06/19/2021  9:24 AM  Visual Inspection No deformities, no ulcerations, no other skin breakdown bilaterally: Yes Sensation Testing Intact to touch and monofilament testing bilaterally: Yes Pulse Check Posterior Tibialis and Dorsalis pulse intact bilaterally: Yes Comments    Lab Results  Component Value Date   MICROALBUR 64.4 (H) 11/20/2019       Relevant past medical, surgical, family and social history reviewed and updated as indicated. Interim medical history since our last visit reviewed. Allergies and medications reviewed and updated. Outpatient  Medications Prior to Visit  Medication Sig Dispense Refill   Blood Glucose Monitoring Suppl (ONE TOUCH ULTRA MINI) w/Device KIT 1 kit by Does not apply route as directed. 1 each 0   carvedilol (COREG) 25 MG tablet Take 1 tablet (25 mg total) by mouth 2 (two) times daily with a meal. 180 tablet 3   Cholecalciferol (VITAMIN D) 50 MCG (2000 UT) CAPS Take 1 capsule (2,000 Units total) by mouth daily. 30 capsule    fluticasone (FLONASE) 50 MCG/ACT nasal spray Place 1 spray into both nostrils daily. 16 g 3   glucose blood (ONE TOUCH ULTRA TEST) test strip Use as instructed to check sugars daily and as needed 100 each 3   Insulin Pen Needle 33G X 6 MM MISC 1 Units by Does not apply route as directed. Use with basaglar pen for daily insulin shot 100 each 3   ONETOUCH DELICA LANCETS 33G 76HC Check blood sugar daily and as directed or needed. 100 each 2   simvastatin (ZOCOR) 20 MG tablet Take 1 tablet (20 mg total) by mouth at bedtime. 90 tablet 3   OZEMPIC, 0.25 OR 0.5 MG/DOSE, 2 MG/1.5ML SOPN Inject into the skin.     valsartan (DIOVAN) 160 MG tablet Take 1 tablet (160 mg total) by mouth daily. 90 tablet 3   Semaglutide,0.25 or 0.5MG/DOS, (OZEMPIC, 0.25 OR 0.5 MG/DOSE,) 2 MG/1.5ML SOPN Inject 0.5 mg into the skin once a week. 1.5 mL 11   No facility-administered medications prior to visit.     Per HPI  unless specifically indicated in ROS section below Review of Systems  Objective:  BP 138/90   Pulse 90   Temp (!) 97.4 F (36.3 C) (Temporal)   Ht 6' 2" (1.88 m)   Wt 240 lb 1 oz (108.9 kg)   SpO2 98%   BMI 30.82 kg/m   Wt Readings from Last 3 Encounters:  06/19/21 240 lb 1 oz (108.9 kg)  03/28/21 242 lb 1 oz (109.8 kg)  11/23/19 248 lb 2 oz (112.5 kg)      Physical Exam Vitals and nursing note reviewed.  Constitutional:      Appearance: Normal appearance. He is not ill-appearing.  Eyes:     Extraocular Movements: Extraocular movements intact.     Conjunctiva/sclera: Conjunctivae  normal.     Pupils: Pupils are equal, round, and reactive to light.  Cardiovascular:     Rate and Rhythm: Normal rate and regular rhythm.     Pulses: Normal pulses.     Heart sounds: Normal heart sounds. No murmur heard. Pulmonary:     Effort: Pulmonary effort is normal. No respiratory distress.     Breath sounds: Normal breath sounds. No wheezing, rhonchi or rales.  Musculoskeletal:     Right lower leg: No edema.     Left lower leg: No edema.     Comments: See HPI for foot exam if done  Skin:    General: Skin is warm and dry.     Findings: No rash.  Neurological:     Mental Status: He is alert.  Psychiatric:        Mood and Affect: Mood normal.        Behavior: Behavior normal.      Results for orders placed or performed in visit on 06/19/21  POCT glycosylated hemoglobin (Hb A1C)  Result Value Ref Range   Hemoglobin A1C 11.0 (A) 4.0 - 5.6 %   HbA1c POC (<> result, manual entry)     HbA1c, POC (prediabetic range)     HbA1c, POC (controlled diabetic range)      Assessment & Plan:  This visit occurred during the SARS-CoV-2 public health emergency.  Safety protocols were in place, including screening questions prior to the visit, additional usage of staff PPE, and extensive cleaning of exam room while observing appropriate contact time as indicated for disinfecting solutions.   Problem List Items Addressed This Visit     Essential hypertension    Chronic, above goal. Will continue to increase valsartan to 350m daily. Continue full dose carvedilol.        Relevant Medications   valsartan (DIOVAN) 320 MG tablet   CKD stage 3 due to type 2 diabetes mellitus ([place]    Continue to increase valsartan to 3212mdaily. RTC 1 wk labs only.  Discussed relation of uncontrolled diabetes to deterioration in kidney function.        Relevant Medications   valsartan (DIOVAN) 320 MG tablet   Semaglutide,0.25 or 0.5MG/DOS, (OZEMPIC, 0.25 OR 0.5 MG/DOSE,) 2 MG/1.5ML SOPN   Insulin  Glargine (BASAGLAR KWIKPEN) 100 UNIT/ML   Other Relevant Orders   Renal function panel   CBC with Differential/Platelet   Uncontrolled diabetes mellitus with diabetic nephropathy, with long-term current use of insulin (HCC) - Primary    Chronic, deteriorated.  Only on ozempic 0.257meekly - will increase to 0.5mg40mekly. Will start basal insulin. Discussed titration schedule.  RTC 6 wks f/u visit.        Relevant Medications   valsartan (DIOVAN)  320 MG tablet   Semaglutide,0.25 or 0.5MG/DOS, (OZEMPIC, 0.25 OR 0.5 MG/DOSE,) 2 MG/1.5ML SOPN   Insulin Glargine (BASAGLAR KWIKPEN) 100 UNIT/ML   Other Relevant Orders   POCT glycosylated hemoglobin (Hb A1C) (Completed)     Meds ordered this encounter  Medications   valsartan (DIOVAN) 320 MG tablet    Sig: Take 1 tablet (320 mg total) by mouth daily.    Dispense:  90 tablet    Refill:  1    Note new dose   Semaglutide,0.25 or 0.5MG/DOS, (OZEMPIC, 0.25 OR 0.5 MG/DOSE,) 2 MG/1.5ML SOPN    Sig: Inject 0.5 mg into the skin once a week.    Dispense:  1.5 mL    Refill:  11   Insulin Glargine (BASAGLAR KWIKPEN) 100 UNIT/ML    Sig: Inject 10 Units into the skin at bedtime.    Dispense:  3 mL    Refill:  1   Orders Placed This Encounter  Procedures   Renal function panel    Standing Status:   Future    Standing Expiration Date:   06/19/2022   CBC with Differential/Platelet    Standing Status:   Future    Standing Expiration Date:   06/19/2022   POCT glycosylated hemoglobin (Hb A1C)     Patient instructions: We will request latest eye exam from Dr Madilyn Hook at [facility] in [place] [phone], fax [phone]).  Increase valsartan to 368m daily. New dose at pharmacy, double up on what you have until you run out.  Continue ozempic 0.539mweekly.  Start basaglar long acting insulin 10 units nightly. Goal fasting sugar <150. May titrate insulin up by 2 units every 3 days if average fasting sugar >150.  Return in 1 week after  starting higher valsartan dose to recheck kidney function.  Return in 6 weeks for follow up visit.   Follow up plan: Return in about 6 weeks (around 07/31/2021), or if symptoms worsen or fail to improve, for follow up visit.  JaRia BushMD

## 2021-06-19 NOTE — Patient Instructions (Addendum)
We will request latest eye exam from Dr Illene Labrador at [facility] in [place] [phone], fax [phone]).  Increase valsartan to 320mg  daily. New dose at pharmacy, double up on what you have until you run out.  Continue ozempic 0.5mg  weekly.  Start basaglar long acting insulin 10 units nightly. Goal fasting sugar <150. May titrate insulin up by 2 units every 3 days if average fasting sugar >150.  Return in 1 week after starting higher valsartan dose to recheck kidney function.  Return in 6 weeks for follow up visit.   Diabetic Nephropathy  Diabetic nephropathy is kidney disease that is caused by diabetes (diabetes mellitus). Kidneys are organs that filter and clean blood and get rid of body waste products and extra fluid. Diabetes can cause gradual kidney damage over many years. Diabetic nephropathy that continues to get worse can lead to kidneyfailure. What are the causes? This condition is caused by kidney damage from diabetes that is not well controlled with treatment. Having high blood sugar (glucose) for a long time because of diabetes can damage blood vessels in the kidneys and cause them to thicken and become scarred. Those changes prevent the kidneysfrom functioning normally. What increases the risk? This condition is more likely to develop in people with diabetes who: Have had diabetes for many years. Have high blood pressure. Have high blood glucose levels over a long period of time. Have a family history of kidney disease. Have a history of tobacco use. Have certain genes that are passed from parent to child (inherited). What are the signs or symptoms? This condition may not cause symptoms at first. If you do have symptoms, they may include: Swelling of your hands, feet, or ankles. Weakness. Poor appetite. Nausea. Confusion. Tiredness (fatigue). Trouble sleeping. Dry, itchy skin. If nephropathy leads to kidney failure, symptoms may include: Vomiting. Shortness of  breath. Jerky movements that you cannot control (seizure). Coma. How is this diagnosed? It is important to diagnose this condition before symptoms develop. You may be screened for diabetic nephropathy at a routine health care visit. Screening tests may include: Urine tests. These may be done every year. Urine collection over a 24-hour period to measure kidney function. Blood tests to measure blood glucose levels and kidney function. Regular blood pressure monitoring. If your health care provider suspects diabetic nephropathy, he or she may: Review your medical history and symptoms. Do a physical exam. Do an ultrasound of your kidneys. Perform a procedure to take a sample of kidney tissue for testing (biopsy). How is this treated? The goal of treatment is to prevent or slow down any damage to your kidneys by managing your diabetes. To do this, it is important to control: Your blood pressure. Your target blood pressure may vary depending on your medical conditions, your age, and other factors. To help control blood pressure, you may be prescribed medicines to lower your blood pressure (ACE inhibitors) or to help your body get rid of excess fluid (diuretics). Your A1c (hemoglobin A1c) level. Generally, the goal of treatment is to maintain an A1c level of less than 7%. Your blood glucose level. Your blood lipids. If you have high cholesterol, you may need to take lipid-lowering drugs, such as statins. Other treatments may include: Medicines, including insulin injections. Lifestyle changes, such as losing weight, quitting smoking, or making changes to your diet. If your disease progresses to end-stage kidney failure, treatment may include: Dialysis. This is a procedure to filter your blood with a machine. Kidney transplant. Follow  these instructions at home: Eating and drinking Eat healthy foods, and eat healthy snacks between meals. Follow instructions from your health care provider about  eating and drinking restrictions. Limit your sodium (salt), protein, or fluid intake as directed. If you drink alcohol: Limit how much you use to: 0-1 drink a day for nonpregnant women. 0-2 drinks a day for men. Be aware of how much alcohol is in your drink. In the [place] one drink equals one 12 oz bottle of beer (355 mL), one 5 oz glass of wine (148 mL), or one 1 oz glass of hard liquor (44 mL). Lifestyle Maintain a healthy weight. Work with your health care provider to lose weight, if needed. Do not use any products that contain nicotine or tobacco, such as cigarettes, e-cigarettes, and chewing tobacco. If you need help quitting, ask your health care provider. Be physically active every day. Ask your health care provider what type of exercise is best for you. Work with your health care provider to manage your blood pressure. General instructions     Follow your diabetes management plan as directed. Check your blood glucose levels as directed by your health care provider. Keep your blood glucose in your target range as directed by your health care provider. Have your A1c level checked two or more times a year, or as often as told by your health care provider. Measure your blood pressure regularly at home, as told by your health care provider. Take over-the-counter and prescription medicines only as told by your health care provider. These include insulin and supplements. Keep all follow-up visits and routine visits as told by your health care provider. This is important. Make sure you get screening tests as directed. Where to find more information American Diabetes Association: www.diabetes.org Contact a health care provider if: You have trouble keeping your blood glucose in your goal range. Your blood glucose level is higher than 240 mg/dL (24.2 mmol/L) for 2 days in a row. You have swelling in your hands, ankles, or feet. You feel weak, tired, or dizzy. You have sudden muscle  tightening (spasms). You have nausea or vomiting. You feel tired all the time. Get help right away if: You are very sleepy. You faint. You have: A seizure. Severe, painful muscle spasms. Shortness of breath. Chest pain. Summary Diabetic nephropathy is kidney disease that is caused by diabetes (diabetes mellitus). Keep your blood sugar (glucose) in your target range as directed by your health care provider. Work with your health care provider to manage your blood pressure. Keep all follow-up visits and routine visits as told by your health care provider. This is important. Make sure you get screening tests as directed. This information is not intended to replace advice given to you by your health care provider. Make sure you discuss any questions you have with your healthcare provider. Document Revised: 05/04/2019 Document Reviewed: 05/04/2019 Elsevier Patient Education  2022 [employer].

## 2021-06-20 ENCOUNTER — Encounter: Payer: Self-pay | Admitting: Family Medicine

## 2021-08-11 ENCOUNTER — Ambulatory Visit: Payer: BC Managed Care – PPO | Admitting: Family Medicine

## 2021-08-19 ENCOUNTER — Other Ambulatory Visit: Payer: Self-pay | Admitting: Family Medicine

## 2021-09-10 ENCOUNTER — Other Ambulatory Visit: Payer: Self-pay | Admitting: Family Medicine

## 2021-10-12 ENCOUNTER — Other Ambulatory Visit: Payer: Self-pay

## 2021-10-12 MED ORDER — BASAGLAR KWIKPEN 100 UNIT/ML ~~LOC~~ SOPN: PEN_INJECTOR | SUBCUTANEOUS | 0 refills | Status: DC

## 2021-10-12 NOTE — Telephone Encounter (Signed)
E-scribed refill.  Plz schedule diabetes f/u asap (was due around 07/31/21).

## 2021-10-12 NOTE — Telephone Encounter (Signed)
Called patient to schedule DM

## 2021-10-13 NOTE — Telephone Encounter (Signed)
Sent a Medical laboratory scientific officer and also lvm for pt to call the office to schedule a f/u apt

## 2021-10-20 ENCOUNTER — Ambulatory Visit: Payer: BC Managed Care – PPO | Admitting: Family Medicine

## 2021-10-20 ENCOUNTER — Encounter: Payer: Self-pay | Admitting: Family Medicine

## 2021-10-20 ENCOUNTER — Other Ambulatory Visit: Payer: Self-pay

## 2021-10-20 VITALS — BP 146/108 | HR 95 | Temp 97.5°F | Ht 74.0 in | Wt 241.4 lb

## 2021-10-20 DIAGNOSIS — E1122 Type 2 diabetes mellitus with diabetic chronic kidney disease: Secondary | ICD-10-CM | POA: Diagnosis not present

## 2021-10-20 DIAGNOSIS — E1121 Type 2 diabetes mellitus with diabetic nephropathy: Secondary | ICD-10-CM | POA: Diagnosis not present

## 2021-10-20 DIAGNOSIS — N183 Chronic kidney disease, stage 3 unspecified: Secondary | ICD-10-CM | POA: Diagnosis not present

## 2021-10-20 DIAGNOSIS — I10 Essential (primary) hypertension: Secondary | ICD-10-CM | POA: Diagnosis not present

## 2021-10-20 DIAGNOSIS — Z23 Encounter for immunization: Secondary | ICD-10-CM | POA: Diagnosis not present

## 2021-10-20 LAB — CBC WITH DIFFERENTIAL/PLATELET
Basophils Absolute: 0 10*3/uL (ref 0.0–0.1)
Basophils Relative: 0.6 % (ref 0.0–3.0)
Eosinophils Absolute: 0.2 10*3/uL (ref 0.0–0.7)
Eosinophils Relative: 5.4 % — ABNORMAL HIGH (ref 0.0–5.0)
HCT: 40.1 % (ref 39.0–52.0)
Hemoglobin: 12.9 g/dL — ABNORMAL LOW (ref 13.0–17.0)
Lymphocytes Relative: 31.9 % (ref 12.0–46.0)
Lymphs Abs: 1.4 10*3/uL (ref 0.7–4.0)
MCHC: 32.1 g/dL (ref 30.0–36.0)
MCV: 81.1 fl (ref 78.0–100.0)
Monocytes Absolute: 0.4 10*3/uL (ref 0.1–1.0)
Monocytes Relative: 8.5 % (ref 3.0–12.0)
Neutro Abs: 2.4 10*3/uL (ref 1.4–7.7)
Neutrophils Relative %: 53.6 % (ref 43.0–77.0)
Platelets: 150 10*3/uL (ref 150.0–400.0)
RBC: 4.94 Mil/uL (ref 4.22–5.81)
RDW: 14.1 % (ref 11.5–15.5)
WBC: 4.4 10*3/uL (ref 4.0–10.5)

## 2021-10-20 LAB — RENAL FUNCTION PANEL
Albumin: 4.2 g/dL (ref 3.5–5.2)
BUN: 33 mg/dL — ABNORMAL HIGH (ref 6–23)
CO2: 23 mEq/L (ref 19–32)
Calcium: 9.7 mg/dL (ref 8.4–10.5)
Chloride: 108 mEq/L (ref 96–112)
Creatinine, Ser: 2.3 mg/dL — ABNORMAL HIGH (ref 0.40–1.50)
GFR: 32.98 mL/min — ABNORMAL LOW (ref >60.00)
Glucose, Bld: 130 mg/dL — ABNORMAL HIGH (ref 70–99)
Phosphorus: 3.6 mg/dL (ref 2.3–4.6)
Potassium: 4.5 mEq/L (ref 3.5–5.1)
Sodium: 139 mEq/L (ref 135–145)

## 2021-10-20 LAB — POCT GLYCOSYLATED HEMOGLOBIN (HGB A1C): Hemoglobin A1C: 7 % — AB (ref 4.0–5.6)

## 2021-10-20 LAB — MICROALBUMIN / CREATININE URINE RATIO
Creatinine,U: 178.7 mg/dL
Microalb Creat Ratio: 67.8 mg/g — ABNORMAL HIGH (ref 0.0–30.0)
Microalb, Ur: 121.1 mg/dL — ABNORMAL HIGH (ref 0.0–1.9)

## 2021-10-20 MED ORDER — OZEMPIC (1 MG/DOSE) 4 MG/3ML ~~LOC~~ SOPN: 1.0000 mg | PEN_INJECTOR | SUBCUTANEOUS | 3 refills | Status: DC

## 2021-10-20 NOTE — Assessment & Plan Note (Addendum)
Chronic, congratulated on marked improvement with A1c down to 7%.  Will continue titration of ozempic to 1mg  weekly, continue basaglar for now. Reviewed GI side effects to monitor for.  Discussed carrying small cooler or asking air attendant to store injectables for flight. He will check with airline.

## 2021-10-20 NOTE — Assessment & Plan Note (Signed)
Diabetes related.  Update renal panel and CBC and urine microalbumin levels.

## 2021-10-20 NOTE — Patient Instructions (Addendum)
Flu shot today. Schedule eye exam for next month when due.  Bp was high - start checking blood pressures at home a few times a week, then call us in 2-3 weeks with readings (or via mychart) Increase ozempic to 1mg  weekly, let know if any trouble tolerating.  Return in 3-4 months for follow up visit.   Diabetes Mellitus and Nutrition, Adult When you have diabetes, or diabetes mellitus, it is very important to have healthy eating habits because your blood sugar (glucose) levels are greatly affected by what you eat and drink. Eating healthy foods in the right amounts, at about the same times every day, can help you: Manage your blood glucose. Lower your risk of heart disease. Improve your blood pressure. Reach or maintain a healthy weight. What can affect my meal plan? Every person with diabetes is different, and each person has different needs for a meal plan. Your health care provider may recommend that you work with a dietitian to make a meal plan that is best for you. Your meal plan may vary depending on factors such as: The calories you need. The medicines you take. Your weight. Your blood glucose, blood pressure, and cholesterol levels. Your activity level. Other health conditions you have, such as heart or kidney disease. How do carbohydrates affect me? Carbohydrates, also called carbs, affect your blood glucose level more than any other type of food. Eating carbs raises the amount of glucose in your blood. It is important to know how many carbs you can safely have in each meal. This is different for every person. Your dietitian can help you calculate how many carbs you should have at each meal and for each snack. How does alcohol affect me? Alcohol can cause a decrease in blood glucose (hypoglycemia), especially if you use insulin or take certain diabetes medicines by mouth. Hypoglycemia can be a life-threatening condition. Symptoms of hypoglycemia, such as sleepiness, dizziness, and  confusion, are similar to symptoms of having too much alcohol. Do not drink alcohol if: Your health care provider tells you not to drink. You are pregnant, may be pregnant, or are planning to become pregnant. If you drink alcohol: Limit how much you have to: 0-1 drink a day for women. 0-2 drinks a day for men. Know how much alcohol is in your drink. In the [place] one drink equals one 12 oz bottle of beer (355 mL), one 5 oz glass of wine (148 mL), or one 1 oz glass of hard liquor (44 mL). Keep yourself hydrated with water, diet soda, or unsweetened iced tea. Keep in mind that regular soda, juice, and other mixers may contain a lot of sugar and must be counted as carbs. What are tips for following this plan? Reading food labels Start by checking the serving size on the Nutrition Facts label of packaged foods and drinks. The number of calories and the amount of carbs, fats, and other nutrients listed on the label are based on one serving of the item. Many items contain more than one serving per package. Check the total grams (g) of carbs in one serving. Check the number of grams of saturated fats and trans fats in one serving. Choose foods that have a low amount or none of these fats. Check the number of milligrams (mg) of salt (sodium) in one serving. Most people should limit total sodium intake to less than 2,300 mg per day. Always check the nutrition information of foods labeled as "low-fat" or "nonfat." These foods may  be higher in added sugar or refined carbs and should be avoided. Talk to your dietitian to identify your daily goals for nutrients listed on the label. Shopping Avoid buying canned, pre-made, or processed foods. These foods tend to be high in fat, sodium, and added sugar. Shop around the outside edge of the grocery store. This is where you will most often find fresh fruits and vegetables, bulk grains, fresh meats, and fresh dairy products. Cooking Use low-heat cooking methods,  such as baking, instead of high-heat cooking methods, such as deep frying. Cook using healthy oils, such as olive, canola, or sunflower oil. Avoid cooking with butter, cream, or high-fat meats. Meal planning Eat meals and snacks regularly, preferably at the same times every day. Avoid going long periods of time without eating. Eat foods that are high in fiber, such as fresh fruits, vegetables, beans, and whole grains. Eat 4-6 oz (112-168 g) of lean protein each day, such as lean meat, chicken, fish, eggs, or tofu. One ounce (oz) (28 g) of lean protein is equal to: 1 oz (28 g) of meat, chicken, or fish. 1 egg.  cup (62 g) of tofu. Eat some foods each day that contain healthy fats, such as avocado, nuts, seeds, and fish. What foods should I eat? Fruits Berries. Apples. Oranges. Peaches. Apricots. Plums. Grapes. Mangoes. Papayas. Pomegranates. Kiwi. Cherries. Vegetables Leafy greens, including lettuce, spinach, kale, chard, collard greens, mustard greens, and cabbage. Beets. Cauliflower. Broccoli. Carrots. Green beans. Tomatoes. Peppers. Onions. Cucumbers. Brussels sprouts. Grains Whole grains, such as whole-wheat or whole-grain bread, crackers, tortillas, cereal, and pasta. Unsweetened oatmeal. Quinoa. Losey or wild rice. Meats and other proteins Seafood. Poultry without skin. Lean cuts of poultry and beef. Tofu. Nuts. Seeds. Dairy Low-fat or fat-free dairy products such as milk, yogurt, and cheese. The items listed above may not be a complete list of foods and beverages you can eat and drink. Contact a dietitian for more information. What foods should I avoid? Fruits Fruits canned with syrup. Vegetables Canned vegetables. Frozen vegetables with butter or cream sauce. Grains Refined white flour and flour products such as bread, pasta, snack foods, and cereals. Avoid all processed foods. Meats and other proteins Fatty cuts of meat. Poultry with skin. Breaded or fried meats. Processed  meat. Avoid saturated fats. Dairy Full-fat yogurt, cheese, or milk. Beverages Sweetened drinks, such as soda or iced tea. The items listed above may not be a complete list of foods and beverages you should avoid. Contact a dietitian for more information. Questions to ask a health care provider Do I need to meet with a certified diabetes care and education specialist? Do I need to meet with a dietitian? What number can I call if I have questions? When are the best times to check my blood glucose? Where to find more information: American Diabetes Association: diabetes.org Academy of Nutrition and Dietetics: eatright.[employer] of Diabetes and Digestive and Kidney Diseases: StageSync.si Association of Diabetes Care & Education Specialists: diabeteseducator.org Summary It is important to have healthy eating habits because your blood sugar (glucose) levels are greatly affected by what you eat and drink. It is important to use alcohol carefully. A healthy meal plan will help you manage your blood glucose and lower your risk of heart disease. Your health care provider may recommend that you work with a dietitian to make a meal plan that is best for you. This information is not intended to replace advice given to you by your health care provider. Make sure  you discuss any questions you have with your health care provider. Document Revised: 06/22/2020 Document Reviewed: 06/22/2020 Elsevier Patient Education  [employer].

## 2021-10-20 NOTE — Progress Notes (Signed)
Patient ID: Rodney Ortiz, male    DOB: Feb 11, 1976, 45 y.o.   MRN: [id number]  This visit was conducted in person.  BP (!) 146/108 (Cuff Size: Large)   Pulse 95   Temp (!) 97.5 F (36.4 C) (Temporal)   Ht 6' 2" (1.88 m)   Wt 241 lb 6 oz (109.5 kg)   SpO2 98%   BMI 30.99 kg/m    CC: 4 mo f/u visit  Subjective:   HPI: Rodney Ortiz is a 45 y.o. male presenting on 10/20/2021 for Diabetes (Here for f/u.)   Upcoming trip to [place] - asks about storage of insulin/ozempic for trip.   HTN - Compliant with current antihypertensive regimen of carvedilol 16m bid, valsartan 329mdaily. Hasn't taken BP meds yet - normally takes at 8:30am. Does not check blood pressures at home. No low blood pressure readings or symptoms of dizziness/syncope.  Denies HA, vision changes, CP/tightness, SOB, leg swelling.    DM - does regularly check sugars: am fasting 180. Compliant with antihyperglycemic regimen which includes: basaglar 10u nightly, ozempic 0.26m26meekly. Intolerant to metformin. Denies low sugars or hypoglycemic symptoms. Denies paresthesias, blurry vision. Last diabetic eye exam 11/2020. Glucometer brand: one touch ultra. Last foot exam: 06/2021[facility] Lab Results  Component Value Date   HGBA1C 7.0 (A) 10/20/2021   Diabetic Foot Exam - Simple   No data filed    Lab Results  Component Value Date   MICROALBUR 64.4 (H) 11/20/2019       Relevant past medical, surgical, family and social history reviewed and updated as indicated. Interim medical history since our last visit reviewed. Allergies and medications reviewed and updated. Outpatient Medications Prior to Visit  Medication Sig Dispense Refill   BD PEN NEEDLE NANO 2ND GEN 32G X 4 MM MISC USE ONCE DAILY 100 each 1   Blood Glucose Monitoring Suppl (ONE TOUCH ULTRA MINI) w/Device KIT 1 kit by Does not apply route as directed. 1 each 0   carvedilol (COREG) 25 MG tablet Take 1 tablet (25 mg total) by mouth 2 (two)  times daily with a meal. 180 tablet 3   Cholecalciferol (VITAMIN D) 50 MCG (2000 UT) CAPS Take 1 capsule (2,000 Units total) by mouth daily. 30 capsule    fluticasone (FLONASE) 50 MCG/ACT nasal spray Place 1 spray into both nostrils daily. 16 g 3   glucose blood (ONE TOUCH ULTRA TEST) test strip Use as instructed to check sugars daily and as needed 100 each 3   Insulin Glargine (BASAGLAR KWIKPEN) 100 UNIT/ML ADMINISTER 10 UNITS UNDER THE SKIN AT BEDTIME 3 mL 0   Insulin Pen Needle 33G X 6 MM MISC 1 Units by Does not apply route as directed. Use with basaglar pen for daily insulin shot 100 each 3   ONETOUCH DELICA LANCETS [id number] Check blood sugar daily and as directed or needed. 100 each 2   simvastatin (ZOCOR) 20 MG tablet Take 1 tablet (20 mg total) by mouth at bedtime. 90 tablet 3   valsartan (DIOVAN) 320 MG tablet Take 1 tablet (320 mg total) by mouth daily. 90 tablet 1   Semaglutide,0.25 or 0.5MG/DOS, (OZEMPIC, 0.25 OR 0.5 MG/DOSE,) 2 MG/1.5ML SOPN Inject 0.5 mg into the skin once a week. 1.5 mL 11   No facility-administered medications prior to visit.     Per HPI unless specifically indicated in ROS section below Review of Systems  Objective:  BP (!) 146/108 (Cuff Size: Large)  Pulse 95   Temp (!) 97.5 F (36.4 C) (Temporal)   Ht 6' 2" (1.88 m)   Wt 241 lb 6 oz (109.5 kg)   SpO2 98%   BMI 30.99 kg/m   Wt Readings from Last 3 Encounters:  10/20/21 241 lb 6 oz (109.5 kg)  06/19/21 240 lb 1 oz (108.9 kg)  03/28/21 242 lb 1 oz (109.8 kg)      Physical Exam Vitals and nursing note reviewed.  Constitutional:      Appearance: Normal appearance. He is not ill-appearing.  Eyes:     Extraocular Movements: Extraocular movements intact.     Conjunctiva/sclera: Conjunctivae normal.     Pupils: Pupils are equal, round, and reactive to light.  Cardiovascular:     Rate and Rhythm: Normal rate and regular rhythm.     Pulses: Normal pulses.     Heart sounds: Normal heart sounds. No  murmur heard. Pulmonary:     Effort: Pulmonary effort is normal. No respiratory distress.     Breath sounds: Normal breath sounds. No wheezing, rhonchi or rales.  Musculoskeletal:     Right lower leg: No edema.     Left lower leg: No edema.     Comments: See HPI for foot exam if done  Skin:    General: Skin is warm and dry.     Findings: No rash.  Neurological:     Mental Status: He is alert.  Psychiatric:        Mood and Affect: Mood normal.        Behavior: Behavior normal.      Results for orders placed or performed in visit on 10/20/21  POCT glycosylated hemoglobin (Hb A1C)  Result Value Ref Range   Hemoglobin A1C 7.0 (A) 4.0 - 5.6 %   HbA1c POC (<> result, manual entry)     HbA1c, POC (prediabetic range)     HbA1c, POC (controlled diabetic range)      Assessment & Plan:  This visit occurred during the SARS-CoV-2 public health emergency.  Safety protocols were in place, including screening questions prior to the visit, additional usage of staff PPE, and extensive cleaning of exam room while observing appropriate contact time as indicated for disinfecting solutions.   Problem List Items Addressed This Visit     Essential hypertension    Chronic, remaining elevated but does decrease on recheck. He has not taken BP meds this morning yet. Advised start monitoring blood pressures at home, notify me if persistently elevated likely to add amlodipine vs BB.       CKD stage 3 due to type 2 diabetes mellitus ([place])    Diabetes related.  Update renal panel and CBC and urine microalbumin levels.       Relevant Medications   Semaglutide, 1 MG/DOSE, (OZEMPIC, 1 MG/DOSE,) 4 MG/3ML SOPN   Other Relevant Orders   Renal function panel   CBC with Differential/Platelet   Microalbumin / creatinine urine ratio   Type 2 diabetes mellitus with nephropathy ([place]) - Primary    Chronic, congratulated on marked improvement with A1c down to 7%.  Will continue titration of ozempic to 42m weekly,  continue basaglar for now. Reviewed GI side effects to monitor for.  Discussed carrying small cooler or asking air attendant to store injectables for flight. He will check with airline.       Relevant Medications   Semaglutide, 1 MG/DOSE, (OZEMPIC, 1 MG/DOSE,) 4 MG/3ML SOPN   Other Relevant Orders   POCT glycosylated  hemoglobin (Hb A1C) (Completed)   Other Visit Diagnoses     Need for influenza vaccination       Relevant Orders   Flu Vaccine QUAD 2moIM (Fluarix, Fluzone & Alfiuria Quad PF) (Completed)        Meds ordered this encounter  Medications   Semaglutide, 1 MG/DOSE, (OZEMPIC, 1 MG/DOSE,) 4 MG/3ML SOPN    Sig: Inject 1 mg into the skin once a week.    Dispense:  9 mL    Refill:  3    Orders Placed This Encounter  Procedures   Flu Vaccine QUAD 627moM (Fluarix, Fluzone & Alfiuria Quad PF)   Renal function panel   CBC with Differential/Platelet   Microalbumin / creatinine urine ratio   POCT glycosylated hemoglobin (Hb A1C)    Patient instructions: Flu shot today. Schedule eye exam for next month when due.  Bp was high - start checking blood pressures at home a few times a week, then call [place] 2-3 weeks with readings (or via mychart) Increase ozempic to 46m40meekly, let us [place] if any trouble tolerating.  Return in 3-4 months for follow up visit.   Follow up plan: Return in about 4 months (around 02/17/2022) for follow up visit.  JavRia BushD

## 2021-10-20 NOTE — Assessment & Plan Note (Addendum)
Chronic, remaining elevated but does decrease on recheck. He has not taken BP meds this morning yet. Advised start monitoring blood pressures at home, notify me if persistently elevated likely to add amlodipine vs BB.

## 2021-11-14 ENCOUNTER — Other Ambulatory Visit: Payer: Self-pay

## 2021-11-14 MED ORDER — BASAGLAR KWIKPEN 100 UNIT/ML ~~LOC~~ SOPN: PEN_INJECTOR | SUBCUTANEOUS | 2 refills | Status: DC

## 2021-12-08 ENCOUNTER — Other Ambulatory Visit: Payer: Self-pay | Admitting: Family Medicine

## 2021-12-30 IMAGING — US US RENAL ARTERY STENOSIS
1 series · 13 of 25 positions shown · non-contrast
Comparison: None.

CLINICAL DATA: 45-year-old male with a kidney pain

EXAM:
RENAL/URINARY TRACT ULTRASOUND
RENAL DUPLEX DOPPLER ULTRASOUND

[Series 1: us renal artery stenosis · 13 of 43 slices shown]
[im 1/43]
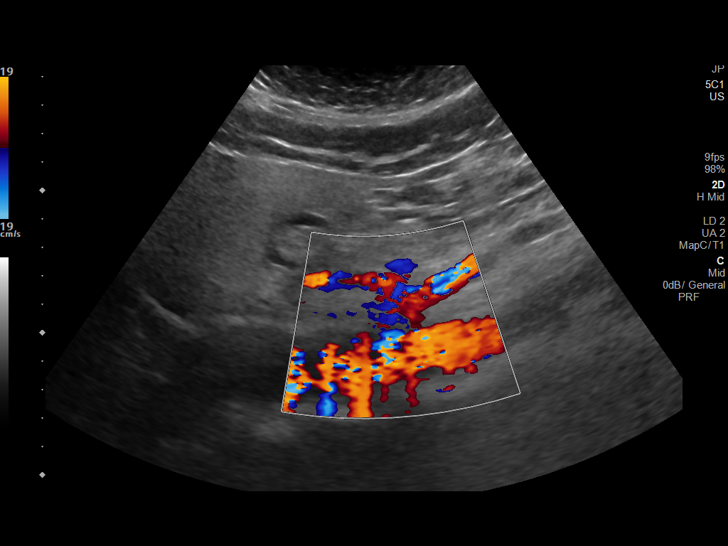
[im 4/43]
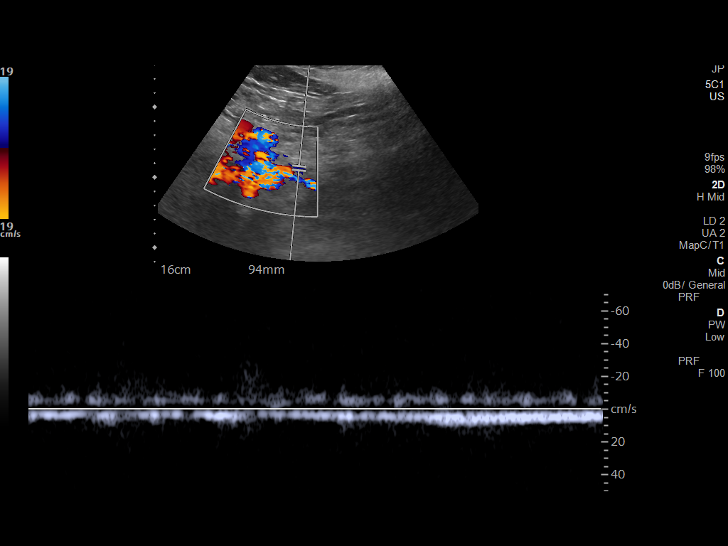
[im 8/43]
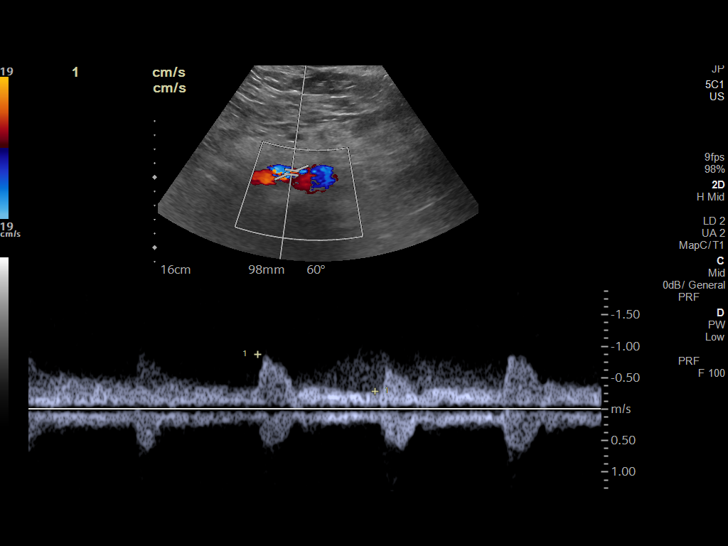
[im 11/43]
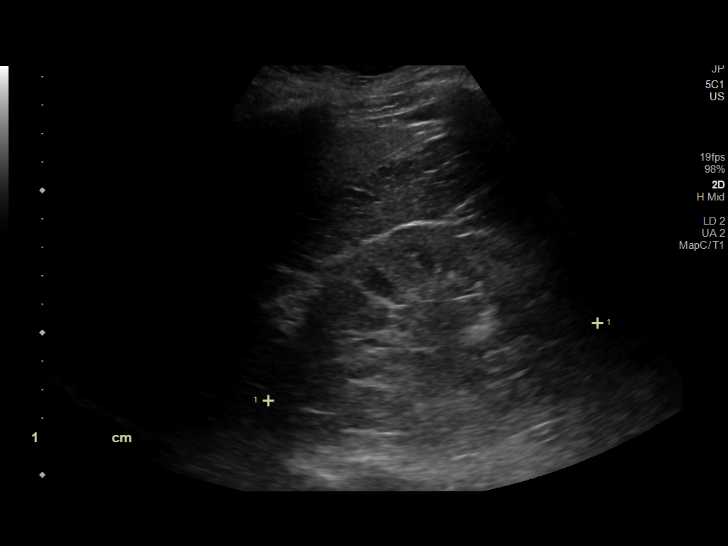
[im 15/43]
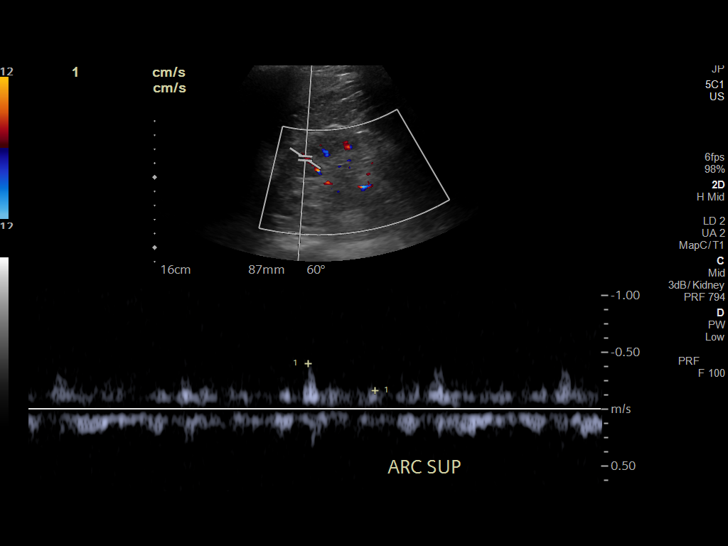
[im 18/43]
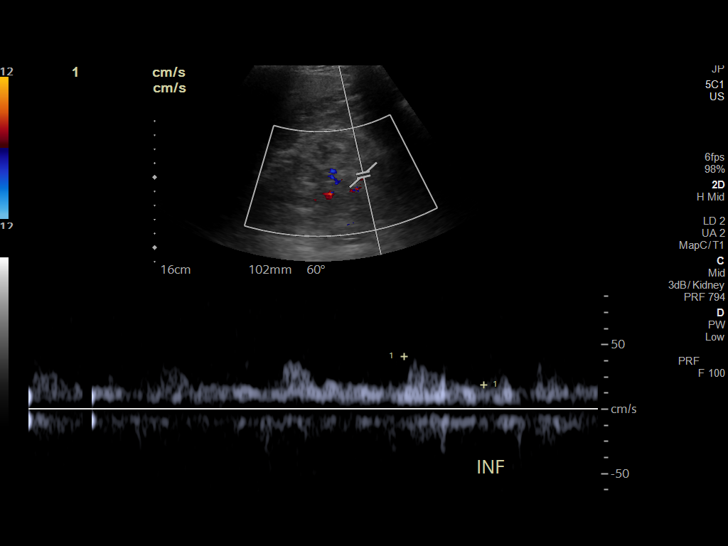
[im 22/43]
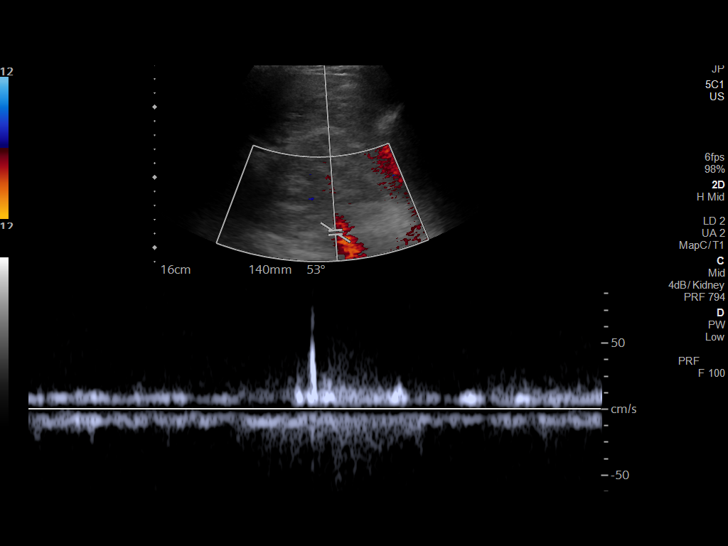
[im 25/43]
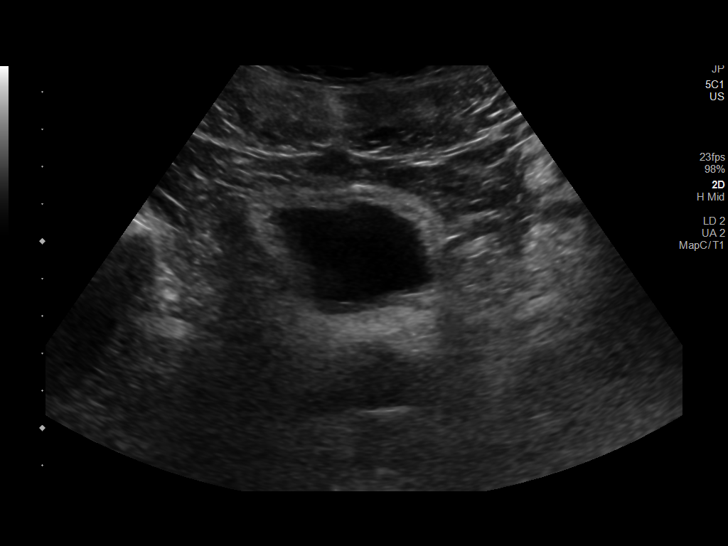
[im 29/43]
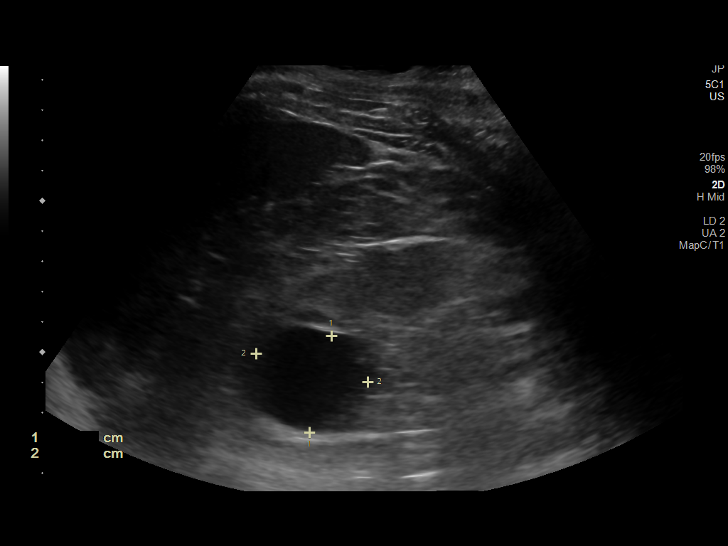
[im 32/43]
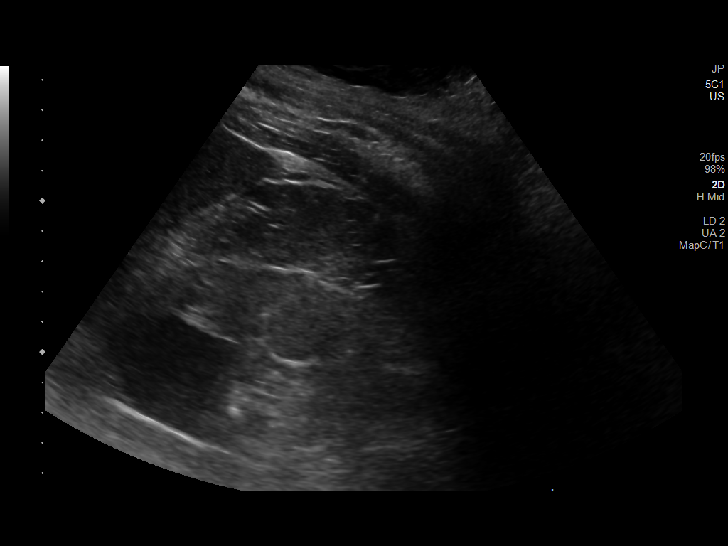
[im 36/43]
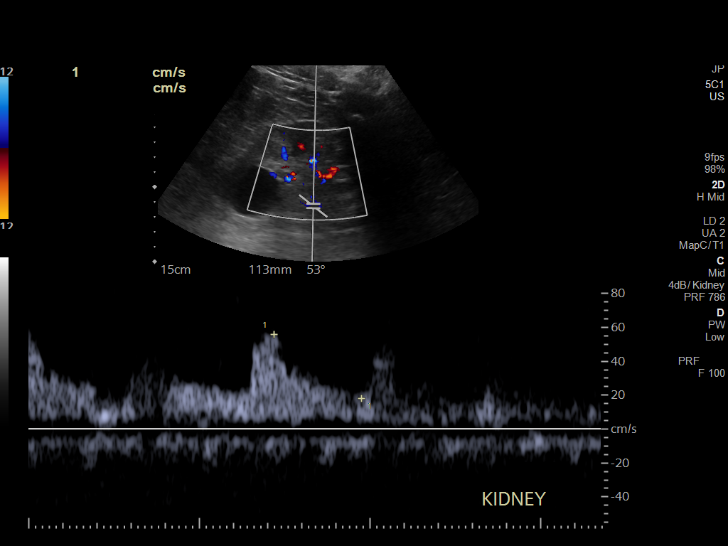
[im 39/43]
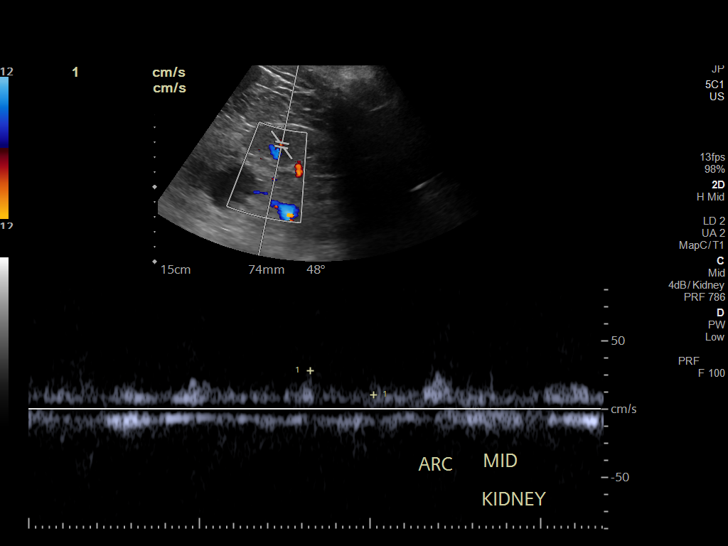
[im 43/43]
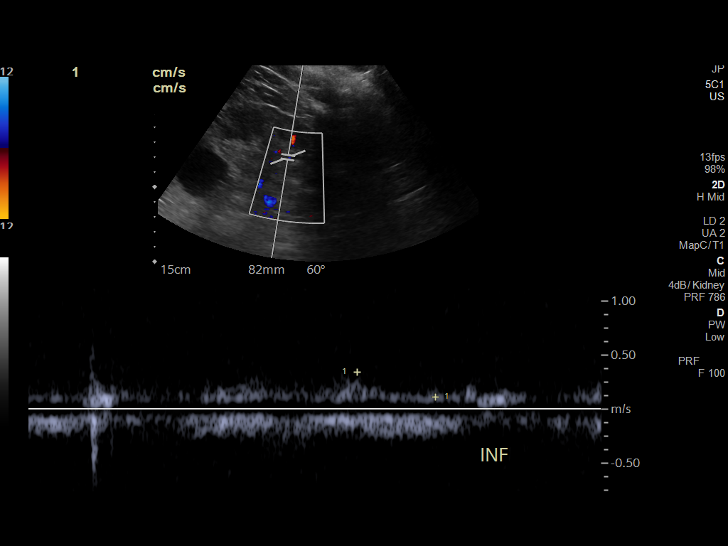

[13 of 25 positions shown; findings below may reference images not displayed]

FINDINGS: Right Kidney:

Length: 11.9 cm. Echogenic renal parenchyma with increased
conspicuity of the corticomedullary interface. Small anechoic simple
cyst in the interpolar kidney measuring 1.4 x 1.4 cm. No evidence of
hydronephrosis or nephrolithiasis.

Left Kidney:

Length: 12.1 cm. Echogenic renal parenchyma with increased
conspicuity of the corticomedullary interface. Anechoic simple cyst
with imperceptible wall exophytic from the upper pole measures 3.3 x
3.8 cm.

Bladder:  Within normal limits.

RENAL DUPLEX ULTRASOUND

Right Renal Artery Velocities:

Origin:  87 cm/sec

Mid:  102 cm/sec

Hilum:  62 cm/sec

Interlobar:  41 cm/sec

Arcuate:  40 cm/sec

Left Renal Artery Velocities:

Origin:  74 cm/sec

Mid:  72 cm/sec

Hilum:  56 cm/sec

Interlobar:  44 cm/sec

Arcuate:  28 cm/sec

Aortic Velocity:  88 cm/sec

Right Renal-Aortic Ratios:

Origin:

Mid:

Hilum:

Interlobar:

Arcuate:

Left Renal-Aortic Ratios:

Origin:

Mid:

Hilum:

Interlobar:

Arcuate:
IMPRESSION: 1. No evidence of hemodynamically significant renal artery stenosis.
2. Echogenic renal parenchyma consistent with underlying medical
renal disease.
3. Bilateral simple renal cysts.

## 2022-02-09 ENCOUNTER — Encounter: Payer: Self-pay | Admitting: Family Medicine

## 2022-02-09 ENCOUNTER — Other Ambulatory Visit: Payer: Self-pay

## 2022-02-09 ENCOUNTER — Ambulatory Visit: Payer: BC Managed Care – PPO | Admitting: Family Medicine

## 2022-02-09 VITALS — BP 140/88 | HR 96 | Temp 97.4°F | Ht 74.0 in | Wt 235.2 lb

## 2022-02-09 DIAGNOSIS — I10 Essential (primary) hypertension: Secondary | ICD-10-CM | POA: Diagnosis not present

## 2022-02-09 DIAGNOSIS — E669 Obesity, unspecified: Secondary | ICD-10-CM

## 2022-02-09 DIAGNOSIS — E1122 Type 2 diabetes mellitus with diabetic chronic kidney disease: Secondary | ICD-10-CM

## 2022-02-09 DIAGNOSIS — E1121 Type 2 diabetes mellitus with diabetic nephropathy: Secondary | ICD-10-CM

## 2022-02-09 DIAGNOSIS — N183 Chronic kidney disease, stage 3 unspecified: Secondary | ICD-10-CM

## 2022-02-09 LAB — POCT GLYCOSYLATED HEMOGLOBIN (HGB A1C): Hemoglobin A1C: 6.7 % — AB (ref 4.0–5.6)

## 2022-02-09 MED ORDER — BASAGLAR KWIKPEN 100 UNIT/ML ~~LOC~~ SOPN: PEN_INJECTOR | SUBCUTANEOUS | 1 refills | Status: DC

## 2022-02-09 MED ORDER — OZEMPIC (1 MG/DOSE) 4 MG/3ML ~~LOC~~ SOPN: 1.0000 mg | PEN_INJECTOR | SUBCUTANEOUS | 3 refills | Status: DC

## 2022-02-09 MED ORDER — DAPAGLIFLOZIN PROPANEDIOL 5 MG PO TABS: 5.0000 mg | ORAL_TABLET | Freq: Every day | ORAL | 6 refills | Status: DC

## 2022-02-09 NOTE — Patient Instructions (Addendum)
Call and schedule eye exam.  ?Foot exam today.  ?Sugars are doing well today!  ?Start farxiga 5mg  daily - diabetes medicine that can help protect kidneys as well. Drop basaglar to 5 units daily for 5-7 days then stop insulin shot.  ?Monitor sugars more closely over first few weeks, let know if any trouble.  ?Return in 2 weeks for lab visit only.  ?Return in 2-3 months for physical.  ?

## 2022-02-09 NOTE — Assessment & Plan Note (Addendum)
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  ?Already on ARB.  ?Start SGLT2i.  ?Recheck renal panel in 2 wks.  ?

## 2022-02-09 NOTE — Assessment & Plan Note (Signed)
Chronic, remains well controlled on current regimen (basaglar, ozempic). Will transition from insulin to [place] due to renoprotective benefit. Reviewed side effects to watch for including UTI, yeast infection, groin cellulitis. Drop basaglar to 5u daily for 5-7 days then stop. Update with effect. Check renal panel 1-2 wks after starting farxiga.  ?RTC 2-3 mo CPE and will reassess diabetes control at that time. Pt agrees with plan.  ?

## 2022-02-09 NOTE — Assessment & Plan Note (Signed)
Congratulated on weight loss noted.  

## 2022-02-09 NOTE — Assessment & Plan Note (Signed)
Chronic, adequate. Anticipate improvement with farxiga addition. ?

## 2022-02-09 NOTE — Progress Notes (Signed)
? ? Patient ID: Rodney Ortiz, male    DOB: 02/17/1976, 46 y.o.   MRN: [id number] ? ?This visit was conducted in person. ? ?BP 140/88   Pulse 96   Temp (!) 97.4 ?F (36.3 ?C) (Temporal)   Ht $R'6\' 2"'qw$  (1.88 m)   Wt 235 lb 4 oz (106.7 kg)   SpO2 98%   BMI 30.20 kg/m?   ? ?CC: DM f/u visit  ?Subjective:  ? ?HPI: ?KEITHEN CAPO is a 46 y.o. male presenting on 02/09/2022 for Diabetes (Here for 3 mo f/u.) ? ? ?DM - does not regularly check sugars. Compliant with antihyperglycemic regimen which includes: basaglar 10u daily, ozempic $RemoveBefore'1mg'IFjMQIFtbncam$  weekly. Doesn't tolerate metformin. Denies low sugars or hypoglycemic symptoms. Denies paresthesias, blurry vision. Last diabetic eye exam 11/2020 - due. Glucometer brand: one-touch ultra-mini. Not interested in CGM. Last foot exam: 06/2021. DSME: 07/2017 at [facility].  ?Lab Results  ?Component Value Date  ? HGBA1C 6.7 (A) 02/09/2022  ? ?Diabetic Foot Exam - Simple   ?Simple Foot Form ?Diabetic Foot exam was performed with the following findings: Yes 02/09/2022  7:48 AM  ?Visual Inspection ?See comments: Yes ?Sensation Testing ?Intact to touch and monofilament testing bilaterally: Yes ?Pulse Check ?Posterior Tibialis and Dorsalis pulse intact bilaterally: Yes ?Comments ?Thickened darker toenails bilaterally ?  ? ?Lab Results  ?Component Value Date  ? MICROALBUR 121.1 (H) 10/20/2021  ? ?   ? ?Relevant past medical, surgical, family and social history reviewed and updated as indicated. Interim medical history since our last visit reviewed. ?Allergies and medications reviewed and updated. ?Outpatient Medications Prior to Visit  ?Medication Sig Dispense Refill  ? BD PEN NEEDLE NANO 2ND GEN 32G X 4 MM MISC USE ONCE DAILY 100 each 1  ? Blood Glucose Monitoring Suppl (ONE TOUCH ULTRA MINI) w/Device KIT 1 kit by Does not apply route as directed. 1 each 0  ? carvedilol (COREG) 25 MG tablet Take 1 tablet (25 mg total) by mouth 2 (two) times daily with a meal. 180 tablet 3  ? Cholecalciferol (VITAMIN D) 50  MCG (2000 UT) CAPS Take 1 capsule (2,000 Units total) by mouth daily. 30 capsule   ? fluticasone (FLONASE) 50 MCG/ACT nasal spray Place 1 spray into both nostrils daily. 16 g 3  ? glucose blood (ONE TOUCH ULTRA TEST) test strip Use as instructed to check sugars daily and as needed 100 each 3  ? Insulin Pen Needle 33G X 6 MM MISC 1 Units by Does not apply route as directed. Use with basaglar pen for daily insulin shot 100 each 3  ? ONETOUCH DELICA LANCETS 42A MISC Check blood sugar daily and as directed or needed. 100 each 2  ? simvastatin (ZOCOR) 20 MG tablet Take 1 tablet (20 mg total) by mouth at bedtime. 90 tablet 3  ? valsartan (DIOVAN) 320 MG tablet TAKE 1 TABLET DAILY (NOTE  NEW DOSE) 90 tablet 1  ? Insulin Glargine (BASAGLAR KWIKPEN) 100 UNIT/ML ADMINISTER 10 UNITS UNDER THE SKIN AT BEDTIME 3 mL 2  ? Semaglutide, 1 MG/DOSE, (OZEMPIC, 1 MG/DOSE,) 4 MG/3ML SOPN Inject 1 mg into the skin once a week. 9 mL 3  ? ?No facility-administered medications prior to visit.  ?  ? ?Per HPI unless specifically indicated in ROS section below ?Review of Systems ? ?Objective:  ?BP 140/88   Pulse 96   Temp (!) 97.4 ?F (36.3 ?C) (Temporal)   Ht $R'6\' 2"'um$  (1.88 m)   Wt 235 lb 4 oz (106.7  kg)   SpO2 98%   BMI 30.20 kg/m?   ?Wt Readings from Last 3 Encounters:  ?02/09/22 235 lb 4 oz (106.7 kg)  ?10/20/21 241 lb 6 oz (109.5 kg)  ?06/19/21 240 lb 1 oz (108.9 kg)  ?  ?  ?Physical Exam ?Vitals and nursing note reviewed.  ?Constitutional:   ?   Appearance: Normal appearance. He is not ill-appearing.  ?Cardiovascular:  ?   Rate and Rhythm: Normal rate and regular rhythm.  ?   Pulses: Normal pulses.  ?   Heart sounds: Normal heart sounds. No murmur heard. ?Pulmonary:  ?   Effort: Pulmonary effort is normal. No respiratory distress.  ?   Breath sounds: Normal breath sounds. No wheezing, rhonchi or rales.  ?Musculoskeletal:  ?   Right lower leg: No edema.  ?   Left lower leg: No edema.  ?Skin: ?   Findings: No rash.  ?Neurological:  ?    Mental Status: He is alert.  ?Psychiatric:     ?   Mood and Affect: Mood normal.     ?   Behavior: Behavior normal.  ? ?   ?Results for orders placed or performed in visit on 02/09/22  ?POCT glycosylated hemoglobin (Hb A1C)  ?Result Value Ref Range  ? Hemoglobin A1C 6.7 (A) 4.0 - 5.6 %  ? HbA1c POC (<> result, manual entry)    ? HbA1c, POC (prediabetic range)    ? HbA1c, POC (controlled diabetic range)    ? ? ?Assessment & Plan:  ?This visit occurred during the SARS-CoV-2 public health emergency.  Safety protocols were in place, including screening questions prior to the visit, additional usage of staff PPE, and extensive cleaning of exam room while observing appropriate contact time as indicated for disinfecting solutions.  ? ?Problem List Items Addressed This Visit   ? ? Essential hypertension  ?  Chronic, adequate. Anticipate improvement with farxiga addition. ?  ?  ? CKD stage 3 due to type 2 diabetes mellitus ([place])  ?  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  ?Already on ARB.  ?Start SGLT2i.  ?Recheck renal panel in 2 wks.  ?  ?  ? Relevant Medications  ? Semaglutide, 1 MG/DOSE, (OZEMPIC, 1 MG/DOSE,) 4 MG/3ML SOPN  ? Insulin Glargine (BASAGLAR KWIKPEN) 100 UNIT/ML  ? dapagliflozin propanediol (FARXIGA) 5 MG TABS tablet  ? Other Relevant Orders  ? Renal function panel  ? 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  Chronic, remains well controlled on current regimen (basaglar, ozempic). Will transition from insulin to [place] due to renoprotective benefit. Reviewed side effects to watch for including UTI, yeast infection, groin cellulitis. Drop basaglar to 5u daily for 5-7 days then stop. Update with effect. Check renal panel 1-2 wks after starting farxiga.  ?RTC 2-3 mo CPE and will reassess diabetes control at that time. Pt agrees with plan.  ?  ?  ? Relevant Medications  ? Semaglutide, 1 MG/DOSE, (OZEMPIC, 1 MG/DOSE,) 4 MG/3ML SOPN  ? Insulin Glargine  (BASAGLAR KWIKPEN) 100 UNIT/ML  ? dapagliflozin propanediol (FARXIGA) 5 MG TABS tablet  ? Other Relevant Orders  ? POCT glycosylated hemoglobin (Hb A1C) (Completed)  ? Renal function panel  ? Obesity, Class I, BMI 30-34.9  ?  Congratulated on weight loss noted.  ?  ?  ?  ? ?Meds ordered this encounter  ?Medications  ? Semaglutide, 1 MG/DOSE, (OZEMPIC, 1 MG/DOSE,) 4 MG/3ML SOPN  ?  Sig: Inject 1 mg into  the skin once a week.  ?  Dispense:  9 mL  ?  Refill:  3  ? Insulin Glargine (BASAGLAR KWIKPEN) 100 UNIT/ML  ?  Sig: ADMINISTER 10 UNITS UNDER THE SKIN AT BEDTIME  ?  Dispense:  3 mL  ?  Refill:  1  ?  On hold  ? dapagliflozin propanediol (FARXIGA) 5 MG TABS tablet  ?  Sig: Take 1 tablet (5 mg total) by mouth daily before breakfast.  ?  Dispense:  30 tablet  ?  Refill:  6  ? ?Orders Placed This Encounter  ?Procedures  ? Renal function panel  ?  Standing Status:   Future  ?  Standing Expiration Date:   02/10/2023  ? POCT glycosylated hemoglobin (Hb A1C)  ? ? ?Patient Instructions  ?Call and schedule eye exam.  ?Foot exam today.  ?Sugars are doing well today!  ?Start farxiga $RemoveBefore'5mg'PipmppCOUIsjE$  daily - diabetes medicine that can help protect kidneys as well. Drop basaglar to 5 units daily for 5-7 days then stop insulin shot.  ?Monitor sugars more closely over first few weeks, let us know if any trouble.  ?Return in 2 weeks for lab visit only.  ?Return in 2-3 months for physical.  ? ?Follow up plan: ?Return in about 3 months (around 05/12/2022) for annual exam, prior fasting for blood work. ? ?Ria Bush, MD   ?

## 2022-04-02 ENCOUNTER — Other Ambulatory Visit: Payer: Self-pay

## 2022-04-10 ENCOUNTER — Telehealth: Payer: Self-pay

## 2022-04-10 DIAGNOSIS — E1121 Type 2 diabetes mellitus with diabetic nephropathy: Secondary | ICD-10-CM

## 2022-04-10 MED ORDER — BD PEN NEEDLE NANO 2ND GEN 32G X 4 MM MISC: 3 refills | Status: DC

## 2022-04-10 NOTE — Telephone Encounter (Signed)
E-scribed refill 

## 2022-05-20 ENCOUNTER — Other Ambulatory Visit: Payer: Self-pay | Admitting: Family Medicine

## 2022-05-20 DIAGNOSIS — E1169 Type 2 diabetes mellitus with other specified complication: Secondary | ICD-10-CM

## 2022-05-20 DIAGNOSIS — Z125 Encounter for screening for malignant neoplasm of prostate: Secondary | ICD-10-CM

## 2022-05-20 DIAGNOSIS — E1121 Type 2 diabetes mellitus with diabetic nephropathy: Secondary | ICD-10-CM

## 2022-05-20 DIAGNOSIS — Z1159 Encounter for screening for other viral diseases: Secondary | ICD-10-CM

## 2022-05-20 DIAGNOSIS — N183 Chronic kidney disease, stage 3 unspecified: Secondary | ICD-10-CM

## 2022-05-25 ENCOUNTER — Other Ambulatory Visit (INDEPENDENT_AMBULATORY_CARE_PROVIDER_SITE_OTHER): Payer: BC Managed Care – PPO

## 2022-05-25 DIAGNOSIS — Z125 Encounter for screening for malignant neoplasm of prostate: Secondary | ICD-10-CM

## 2022-05-25 DIAGNOSIS — Z1159 Encounter for screening for other viral diseases: Secondary | ICD-10-CM

## 2022-05-25 DIAGNOSIS — E1169 Type 2 diabetes mellitus with other specified complication: Secondary | ICD-10-CM | POA: Diagnosis not present

## 2022-05-25 DIAGNOSIS — E1122 Type 2 diabetes mellitus with diabetic chronic kidney disease: Secondary | ICD-10-CM

## 2022-05-25 DIAGNOSIS — N183 Chronic kidney disease, stage 3 unspecified: Secondary | ICD-10-CM | POA: Diagnosis not present

## 2022-05-25 DIAGNOSIS — E785 Hyperlipidemia, unspecified: Secondary | ICD-10-CM

## 2022-05-25 DIAGNOSIS — E1121 Type 2 diabetes mellitus with diabetic nephropathy: Secondary | ICD-10-CM | POA: Diagnosis not present

## 2022-05-25 LAB — LIPID PANEL
Cholesterol: 123 mg/dL (ref 0–200)
HDL: 31.5 mg/dL — ABNORMAL LOW (ref >39.00)
LDL Cholesterol: 52 mg/dL (ref 0–99)
NonHDL: 91.29
Total CHOL/HDL Ratio: 4
Triglycerides: 197 mg/dL — ABNORMAL HIGH (ref 0.0–149.0)
VLDL: 39.4 mg/dL (ref 0.0–40.0)

## 2022-05-25 LAB — CBC WITH DIFFERENTIAL/PLATELET
Basophils Absolute: 0 10*3/uL (ref 0.0–0.1)
Basophils Relative: 0.8 % (ref 0.0–3.0)
Eosinophils Absolute: 0.2 10*3/uL (ref 0.0–0.7)
Eosinophils Relative: 4.9 % (ref 0.0–5.0)
HCT: 40.4 % (ref 39.0–52.0)
Hemoglobin: 13.2 g/dL (ref 13.0–17.0)
Lymphocytes Relative: 30.6 % (ref 12.0–46.0)
Lymphs Abs: 1.5 10*3/uL (ref 0.7–4.0)
MCHC: 32.7 g/dL (ref 30.0–36.0)
MCV: 81.5 fl (ref 78.0–100.0)
Monocytes Absolute: 0.4 10*3/uL (ref 0.1–1.0)
Monocytes Relative: 8.3 % (ref 3.0–12.0)
Neutro Abs: 2.7 10*3/uL (ref 1.4–7.7)
Neutrophils Relative %: 55.4 % (ref 43.0–77.0)
Platelets: 137 10*3/uL — ABNORMAL LOW (ref 150.0–400.0)
RBC: 4.96 Mil/uL (ref 4.22–5.81)
RDW: 14.5 % (ref 11.5–15.5)
WBC: 5 10*3/uL (ref 4.0–10.5)

## 2022-05-25 LAB — COMPREHENSIVE METABOLIC PANEL
ALT: 19 U/L (ref 0–53)
AST: 17 U/L (ref 0–37)
Albumin: 4.1 g/dL (ref 3.5–5.2)
Alkaline Phosphatase: 76 U/L (ref 39–117)
BUN: 30 mg/dL — ABNORMAL HIGH (ref 6–23)
CO2: 25 mEq/L (ref 19–32)
Calcium: 9.7 mg/dL (ref 8.4–10.5)
Chloride: 106 mEq/L (ref 96–112)
Creatinine, Ser: 2.53 mg/dL — ABNORMAL HIGH (ref 0.40–1.50)
GFR: 29.3 mL/min — ABNORMAL LOW (ref >60.00)
Glucose, Bld: 181 mg/dL — ABNORMAL HIGH (ref 70–99)
Potassium: 4.4 mEq/L (ref 3.5–5.1)
Sodium: 139 mEq/L (ref 135–145)
Total Bilirubin: 0.7 mg/dL (ref 0.2–1.2)
Total Protein: 7 g/dL (ref 6.0–8.3)

## 2022-05-25 LAB — MICROALBUMIN / CREATININE URINE RATIO
Creatinine,U: 140 mg/dL
Microalb Creat Ratio: 53 mg/g — ABNORMAL HIGH (ref 0.0–30.0)
Microalb, Ur: 74.2 mg/dL — ABNORMAL HIGH (ref 0.0–1.9)

## 2022-05-25 LAB — VITAMIN D 25 HYDROXY (VIT D DEFICIENCY, FRACTURES): VITD: 44.73 ng/mL (ref 30.00–100.00)

## 2022-05-25 LAB — HEMOGLOBIN A1C: Hgb A1c MFr Bld: 7.8 % — ABNORMAL HIGH (ref 4.6–6.5)

## 2022-05-25 LAB — PSA: PSA: 1.24 ng/mL (ref 0.10–4.00)

## 2022-05-28 LAB — PROTEIN ELECTROPHORESIS, SERUM, WITH REFLEX
Albumin ELP: 4.1 g/dL (ref 3.8–4.8)
Alpha 1: 0.3 g/dL (ref 0.2–0.3)
Alpha 2: 0.6 g/dL (ref 0.5–0.9)
Beta 2: 0.4 g/dL (ref 0.2–0.5)
Beta Globulin: 0.4 g/dL (ref 0.4–0.6)
Gamma Globulin: 1.4 g/dL (ref 0.8–1.7)
Total Protein: 7.2 g/dL (ref 6.1–8.1)

## 2022-05-28 LAB — HEPATITIS C ANTIBODY: Hepatitis C Ab: NONREACTIVE

## 2022-05-28 LAB — PARATHYROID HORMONE, INTACT (NO CA): PTH: 90 pg/mL — ABNORMAL HIGH (ref 16–77)

## 2022-06-01 ENCOUNTER — Encounter: Payer: Self-pay | Admitting: Family Medicine

## 2022-06-01 ENCOUNTER — Ambulatory Visit (INDEPENDENT_AMBULATORY_CARE_PROVIDER_SITE_OTHER): Payer: BC Managed Care – PPO | Admitting: Family Medicine

## 2022-06-01 VITALS — BP 126/80 | HR 100 | Ht 75.0 in | Wt 233.0 lb

## 2022-06-01 DIAGNOSIS — E1169 Type 2 diabetes mellitus with other specified complication: Secondary | ICD-10-CM

## 2022-06-01 DIAGNOSIS — N2581 Secondary hyperparathyroidism of renal origin: Secondary | ICD-10-CM

## 2022-06-01 DIAGNOSIS — D696 Thrombocytopenia, unspecified: Secondary | ICD-10-CM

## 2022-06-01 DIAGNOSIS — I10 Essential (primary) hypertension: Secondary | ICD-10-CM

## 2022-06-01 DIAGNOSIS — N183 Chronic kidney disease, stage 3 unspecified: Secondary | ICD-10-CM

## 2022-06-01 DIAGNOSIS — Z0001 Encounter for general adult medical examination with abnormal findings: Secondary | ICD-10-CM

## 2022-06-01 DIAGNOSIS — E1122 Type 2 diabetes mellitus with diabetic chronic kidney disease: Secondary | ICD-10-CM

## 2022-06-01 DIAGNOSIS — Z1211 Encounter for screening for malignant neoplasm of colon: Secondary | ICD-10-CM

## 2022-06-01 DIAGNOSIS — E1121 Type 2 diabetes mellitus with diabetic nephropathy: Secondary | ICD-10-CM

## 2022-06-01 DIAGNOSIS — E785 Hyperlipidemia, unspecified: Secondary | ICD-10-CM

## 2022-06-01 MED ORDER — SIMVASTATIN 20 MG PO TABS
20.0000 mg | ORAL_TABLET | Freq: Every day | ORAL | 3 refills | Status: DC
Start: 2022-06-01 — End: 2023-05-20

## 2022-06-01 MED ORDER — DAPAGLIFLOZIN PROPANEDIOL 10 MG PO TABS
10.0000 mg | ORAL_TABLET | Freq: Every day | ORAL | 3 refills | Status: DC
Start: 2022-06-01 — End: 2023-05-20

## 2022-06-01 MED ORDER — VALSARTAN 320 MG PO TABS: 320.0000 mg | ORAL_TABLET | Freq: Every day | ORAL | 3 refills | Status: DC

## 2022-06-01 MED ORDER — CARVEDILOL 25 MG PO TABS
25.0000 mg | ORAL_TABLET | Freq: Two times a day (BID) | ORAL | 3 refills | Status: DC
Start: 2022-06-01 — End: 2023-05-20

## 2022-06-01 NOTE — Assessment & Plan Note (Signed)
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 Refer to nephrology to establish care. Continue ARB, increase SGLT2i to farxiga 10mg .

## 2022-06-01 NOTE — Assessment & Plan Note (Signed)
Chronic, deteriorated off basaglar but now on farxiga in addition to ozempic. Will increase Farxiga to 10mg  daily and discussed starting basaglar if fasting sugar >150 despite full dose . RTC 3 mo DM f/u visit.

## 2022-06-01 NOTE — Assessment & Plan Note (Signed)
Mild. Will monitor. Consider periph smear next labs.

## 2022-06-01 NOTE — Progress Notes (Signed)
Patient ID: Rodney Ortiz, male    DOB: 14-Feb-1976, 46 y.o.   MRN: [id number]  This visit was conducted in person.  BP 126/80   Pulse 100   Ht $R'6\' 3"'ZP$  (1.905 m)   Wt 233 lb (105.7 kg)   SpO2 99%   BMI 29.12 kg/m    CC: CPE Subjective:   HPI: Rodney Ortiz is a 46 y.o. male presenting on 06/01/2022 for Annual Exam (Physical , discuss labs, no other concerns )   HTN - on valsartan $RemoveBefo'320mg'vEKxsRUfrYz$  daily, carvedilol $RemoveBeforeDEI'25mg'sATuukkJGusWzyjg$  bid.  HLD - on simvastatin $RemoveBefore'20mg'sAJBcJHRQuwMo$  daily.   DM - continues farxiga $RemoveBeforeDEI'5mg'DvNnpFpJLuXBzKEm$  daily and ozempic $RemoveBef'1mg'DPaSQbmktj$  weekly. Stopped checking sugars - OneTouch battery died. Wilder Glade started in place of basaglar 01/2022. Enjoys peach cobbler. Did not tolerate metformin due to GI upset, we also were avoiding in h/o CKD.   CKD stage 3-4 - last visit we started farxiga. Also on full dose valsartan.   Preventative: Colon cancer screening - would like to continue iFOB.  Prostate cancer screening - no known fmhx prostate cancer. PSA yearly  Flu shot yearly  COVID vaccine 01/2020, 02/2020  Tdap 2015  Pneumovax-23 2015  Seat belt use discussed  Sunscreen use discussed. No changing moles on skin  Sleep - averaging 8 hours/night, no PNdyspnea, good restorative sleep, no daytime sleepiness  Non smoker  Alcohol - none  Dentist - occ Eye exam - due to see   Lives with wife Rodney Ortiz) Occ: Regulatory affairs officer in [employer]  Activity: limited - previously walking, enjoys hiking in [place]  Diet: good water, fruits/vegetables daily      Relevant past medical, surgical, family and social history reviewed and updated as indicated. Interim medical history since our last visit reviewed. Allergies and medications reviewed and updated. Outpatient Medications Prior to Visit  Medication Sig Dispense Refill   Blood Glucose Monitoring Suppl (ONE TOUCH ULTRA MINI) w/Device KIT 1 kit by Does not apply route as directed. 1 each 0   Cholecalciferol (VITAMIN D) 50 MCG (2000 UT) CAPS Take 1 capsule (2,000 Units total) by mouth  daily. 30 capsule    fluticasone (FLONASE) 50 MCG/ACT nasal spray Place 1 spray into both nostrils daily. 16 g 3   glucose blood (ONE TOUCH ULTRA TEST) test strip Use as instructed to check sugars daily and as needed 100 each 3   Insulin Pen Needle (BD PEN NEEDLE NANO 2ND GEN) 32G X 4 MM MISC Use to inject insulin daily 100 each 3   ONETOUCH DELICA LANCETS 04H MISC Check blood sugar daily and as directed or needed. 100 each 2   Semaglutide, 1 MG/DOSE, (OZEMPIC, 1 MG/DOSE,) 4 MG/3ML SOPN Inject 1 mg into the skin once a week. 9 mL 3   carvedilol (COREG) 25 MG tablet Take 1 tablet (25 mg total) by mouth 2 (two) times daily with a meal. 180 tablet 3   dapagliflozin propanediol (FARXIGA) 5 MG TABS tablet Take 1 tablet (5 mg total) by mouth daily before breakfast. 30 tablet 6   simvastatin (ZOCOR) 20 MG tablet Take 1 tablet (20 mg total) by mouth at bedtime. 90 tablet 3   valsartan (DIOVAN) 320 MG tablet TAKE 1 TABLET DAILY (NOTE  NEW DOSE) 90 tablet 1   Insulin Glargine (BASAGLAR KWIKPEN) 100 UNIT/ML ADMINISTER 10 UNITS UNDER THE SKIN AT BEDTIME (Patient not taking: Reported on 06/01/2022) 3 mL 1   No facility-administered medications prior to visit.     Per HPI unless specifically indicated  in ROS section below Review of Systems  Constitutional:  Negative for activity change, appetite change, chills, fatigue, fever and unexpected weight change.  HENT:  Negative for hearing loss.   Eyes:  Negative for visual disturbance.  Respiratory:  Negative for cough, chest tightness, shortness of breath and wheezing.   Cardiovascular:  Negative for chest pain, palpitations and leg swelling.  Gastrointestinal:  Negative for abdominal distention, abdominal pain, blood in stool, constipation, diarrhea, nausea and vomiting.  Genitourinary:  Negative for difficulty urinating and hematuria.  Musculoskeletal:  Negative for arthralgias, myalgias and neck pain.  Skin:  Negative for rash.  Neurological:  Negative for  dizziness, seizures, syncope and headaches.  Hematological:  Negative for adenopathy. Does not bruise/bleed easily.  Psychiatric/Behavioral:  Negative for dysphoric mood. The patient is not nervous/anxious.     Objective:  BP 126/80   Pulse 100   Ht $R'6\' 3"'ZE$  (1.905 m)   Wt 233 lb (105.7 kg)   SpO2 99%   BMI 29.12 kg/m   Wt Readings from Last 3 Encounters:  06/01/22 233 lb (105.7 kg)  02/09/22 235 lb 4 oz (106.7 kg)  10/20/21 241 lb 6 oz (109.5 kg)      Physical Exam Vitals and nursing note reviewed.  Constitutional:      General: He is not in acute distress.    Appearance: Normal appearance. He is well-developed. He is not ill-appearing.  HENT:     Head: Normocephalic and atraumatic.     Right Ear: Hearing, tympanic membrane, ear canal and external ear normal.     Left Ear: Hearing, tympanic membrane, ear canal and external ear normal.  Eyes:     General: No scleral icterus.    Extraocular Movements: Extraocular movements intact.     Conjunctiva/sclera: Conjunctivae normal.     Pupils: Pupils are equal, round, and reactive to light.  Neck:     Thyroid: No thyroid mass or thyromegaly.  Cardiovascular:     Rate and Rhythm: Normal rate and regular rhythm.     Pulses: Normal pulses.          Radial pulses are 2+ on the right side and 2+ on the left side.     Heart sounds: Normal heart sounds. No murmur heard. Pulmonary:     Effort: Pulmonary effort is normal. No respiratory distress.     Breath sounds: Normal breath sounds. No wheezing, rhonchi or rales.  Abdominal:     General: Bowel sounds are normal. There is no distension.     Palpations: Abdomen is soft. There is no mass.     Tenderness: There is no abdominal tenderness. There is no guarding or rebound.     Hernia: No hernia is present.  Musculoskeletal:        General: Normal range of motion.     Cervical back: Normal range of motion and neck supple.     Right lower leg: No edema.     Left lower leg: No edema.   Lymphadenopathy:     Cervical: No cervical adenopathy.  Skin:    General: Skin is warm and dry.     Findings: No rash.  Neurological:     General: No focal deficit present.     Mental Status: He is alert and oriented to person, place, and time.  Psychiatric:        Mood and Affect: Mood normal.        Behavior: Behavior normal.        Thought  Content: Thought content normal.        Judgment: Judgment normal.       Results for orders placed or performed in visit on 05/25/22  Serum protein electrophoresis with reflex  Result Value Ref Range   Total Protein 7.2 6.1 - 8.1 g/dL   Albumin ELP 4.1 3.8 - 4.8 g/dL   Alpha 1 0.3 0.2 - 0.3 g/dL   Alpha 2 0.6 0.5 - 0.9 g/dL   Beta Globulin 0.4 0.4 - 0.6 g/dL   Beta 2 0.4 0.2 - 0.5 g/dL   Gamma Globulin 1.4 0.8 - 1.7 g/dL   SPE Interp.    PSA  Result Value Ref Range   PSA 1.24 0.10 - 4.00 ng/mL  Hemoglobin A1c  Result Value Ref Range   Hgb A1c MFr Bld 7.8 (H) 4.6 - 6.5 %  Parathyroid hormone, intact (no Ca)  Result Value Ref Range   PTH 90 (H) 16 - 77 pg/mL  Hepatitis C antibody  Result Value Ref Range   Hepatitis C Ab NON-REACTIVE NON-REACTIVE  Microalbumin / creatinine urine ratio  Result Value Ref Range   Microalb, Ur 74.2 (H) 0.0 - 1.9 mg/dL   Creatinine,U 140.0 mg/dL   Microalb Creat Ratio 53.0 (H) 0.0 - 30.0 mg/g  CBC with Differential/Platelet  Result Value Ref Range   WBC 5.0 4.0 - 10.5 K/uL   RBC 4.96 4.22 - 5.81 Mil/uL   Hemoglobin 13.2 13.0 - 17.0 g/dL   HCT 40.4 39.0 - 52.0 %   MCV 81.5 78.0 - 100.0 fl   MCHC 32.7 30.0 - 36.0 g/dL   RDW 14.5 11.5 - 15.5 %   Platelets 137.0 (L) 150.0 - 400.0 K/uL   Neutrophils Relative % 55.4 43.0 - 77.0 %   Lymphocytes Relative 30.6 12.0 - 46.0 %   Monocytes Relative 8.3 3.0 - 12.0 %   Eosinophils Relative 4.9 0.0 - 5.0 %   Basophils Relative 0.8 0.0 - 3.0 %   Neutro Abs 2.7 1.4 - 7.7 K/uL   Lymphs Abs 1.5 0.7 - 4.0 K/uL   Monocytes Absolute 0.4 0.1 - 1.0 K/uL    Eosinophils Absolute 0.2 0.0 - 0.7 K/uL   Basophils Absolute 0.0 0.0 - 0.1 K/uL  Comprehensive metabolic panel  Result Value Ref Range   Sodium 139 135 - 145 mEq/L   Potassium 4.4 3.5 - 5.1 mEq/L   Chloride 106 96 - 112 mEq/L   CO2 25 19 - 32 mEq/L   Glucose, Bld 181 (H) 70 - 99 mg/dL   BUN 30 (H) 6 - 23 mg/dL   Creatinine, Ser 2.53 (H) 0.40 - 1.50 mg/dL   Total Bilirubin 0.7 0.2 - 1.2 mg/dL   Alkaline Phosphatase 76 39 - 117 U/L   AST 17 0 - 37 U/L   ALT 19 0 - 53 U/L   Total Protein 7.0 6.0 - 8.3 g/dL   Albumin 4.1 3.5 - 5.2 g/dL   GFR 29.30 (L) >60.00 mL/min   Calcium 9.7 8.4 - 10.5 mg/dL  Lipid panel  Result Value Ref Range   Cholesterol 123 0 - 200 mg/dL   Triglycerides 197.0 (H) 0.0 - 149.0 mg/dL   HDL 31.50 (L) >39.00 mg/dL   VLDL 39.4 0.0 - 40.0 mg/dL   LDL Cholesterol 52 0 - 99 mg/dL   Total CHOL/HDL Ratio 4    NonHDL 91.29   VITAMIN D 25 Hydroxy (Vit-D Deficiency, Fractures)  Result Value Ref Range   VITD 44.73 30.00 -  100.00 ng/mL    Assessment & Plan:   Problem List Items Addressed This Visit     Encounter for routine adult health examination with abnormal findings - Primary (Chronic)    Preventative protocols reviewed and updated unless pt declined. Discussed healthy diet and lifestyle.       Hyperlipidemia associated with type 2 diabetes mellitus (HCC)    Chronic, stable on simvastatin $RemoveBefore'20mg'tWCOHuzexvKcR$  daily. Triglycerides remain elevated - will work towards better sugar control.  The ASCVD Risk score (Arnett DK, et al., 2019) failed to calculate for the following reasons:   The valid total cholesterol range is 130 to 320 mg/dL       Relevant Medications   dapagliflozin propanediol (FARXIGA) 10 MG TABS tablet   carvedilol (COREG) 25 MG tablet   simvastatin (ZOCOR) 20 MG tablet   valsartan (DIOVAN) 320 MG tablet   Essential hypertension    Chronic, stable on current regimen - continue.       Relevant Medications   carvedilol (COREG) 25 MG tablet    simvastatin (ZOCOR) 20 MG tablet   valsartan (DIOVAN) 320 MG tablet   CKD stage 3 due to type 2 diabetes mellitus (HCC)    Diabetic macroalbuminuric nephropathy with GFR consistently 29-30 over the past year. Refer to nephrology to establish care. Continue ARB, increase SGLT2i to farxiga $RemoveBe'10mg'gxKcpIqbA$ .       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 Will increase Farxiga to $RemoveBe'10mg'AyxxOxlWA$  daily and discussed starting basaglar if fasting sugar >150 despite full dose [place]. RTC 3 mo DM f/u visit.       Relevant Medications   dapagliflozin propanediol (FARXIGA) 10 MG TABS tablet   simvastatin (ZOCOR) 20 MG tablet   valsartan (DIOVAN) 320 MG tablet   Other Relevant Orders   Ambulatory referral to Nephrology   Secondary hyperparathyroidism of renal origin [facility])    Reviewed with patient. If PTH>150, consider treatment.       Relevant Orders   Ambulatory referral to Nephrology   Thrombocytopenia ([place])    Mild. Will monitor. Consider periph smear next labs.       Other Visit Diagnoses     Special screening for malignant neoplasms, colon       Relevant Orders   Fecal occult blood, imunochemical        Meds ordered this encounter  Medications   dapagliflozin propanediol (FARXIGA) 10 MG TABS tablet    Sig: Take 1 tablet (10 mg total) by mouth daily before breakfast.    Dispense:  90 tablet    Refill:  3    Note new dose   carvedilol (COREG) 25 MG tablet    Sig: Take 1 tablet (25 mg total) by mouth 2 (two) times daily with a meal.    Dispense:  180 tablet    Refill:  3   simvastatin (ZOCOR) 20 MG tablet    Sig: Take 1 tablet (20 mg total) by mouth at bedtime.    Dispense:   90 tablet    Refill:  3   valsartan (DIOVAN) 320 MG tablet    Sig: Take 1 tablet (320 mg total) by mouth daily.    Dispense:  90 tablet    Refill:  3   Orders Placed This Encounter  Procedures   Fecal occult blood, imunochemical    Standing Status:   Future    Standing Expiration Date:   06/02/2023   Ambulatory referral to Nephrology    Referral Priority:   Routine    Referral Type:   Consultation    Referral Reason:   Specialty Services Required    Requested Specialty:   Nephrology    Number of Visits Requested:   1    Patient instructions: Pass by lab to pick up stool kit.  Increase farxiga to $RemoveBe'10mg'ejgYNcTuX$  daily. Keep an eye on blood sugars. If average fasting sugar staying over 150, then restart basaglar at 5 units daily. If persistently elevated over 150 after basaglar, titrate back up to previous dose of 10 units if needed.  Schedule eye doctor diabetic eye exam.  We will refer you to kidney doctor for an evaluation of persistent chronic kidney disease.  Return in 3 months for diabetes follow up.   Follow up plan: Return in about 3 months (around 09/01/2022) for follow up visit.  Ria Bush, MD

## 2022-06-01 NOTE — Patient Instructions (Addendum)
Pass by lab to pick up stool kit.  Increase farxiga to 10mg daily. Keep an eye on blood sugars. If average fasting sugar staying over 150, then restart basaglar at 5 units daily. If persistently elevated over 150 after basaglar, titrate back up to previous dose of 10 units if needed.  Schedule eye doctor diabetic eye exam.  We will refer you to kidney doctor for an evaluation of persistent chronic kidney disease.  Return in 3 months for diabetes follow up.   Health Maintenance, Male Adopting a healthy lifestyle and getting preventive care are important in promoting health and wellness. Ask your health care provider about: The right schedule for you to have regular tests and exams. Things you can do on your own to prevent diseases and keep yourself healthy. What should I know about diet, weight, and exercise? Eat a healthy diet  Eat a diet that includes plenty of vegetables, fruits, low-fat dairy products, and lean protein. Do not eat a lot of foods that are high in solid fats, added sugars, or sodium. Maintain a healthy weight Body mass index (BMI) is a measurement that can be used to identify possible weight problems. It estimates body fat based on height and weight. Your health care provider can help determine your BMI and help you achieve or maintain a healthy weight. Get regular exercise Get regular exercise. This is one of the most important things you can do for your health. Most adults should: Exercise for at least 150 minutes each week. The exercise should increase your heart rate and make you sweat (moderate-intensity exercise). Do strengthening exercises at least twice a week. This is in addition to the moderate-intensity exercise. Spend less time sitting. Even light physical activity can be beneficial. Watch cholesterol and blood lipids Have your blood tested for lipids and cholesterol at 48 years of age, then have this test every 5 years. You may need to have your cholesterol  levels checked more often if: Your lipid or cholesterol levels are high. You are older than 48 years of age. You are at high risk for heart disease. What should I know about cancer screening? Many types of cancers can be detected early and may often be prevented. Depending on your health history and family history, you may need to have cancer screening at various ages. This may include screening for: Colorectal cancer. Prostate cancer. Skin cancer. Lung cancer. What should I know about heart disease, diabetes, and high blood pressure? Blood pressure and heart disease High blood pressure causes heart disease and increases the risk of stroke. This is more likely to develop in people who have high blood pressure readings or are overweight. Talk with your health care provider about your target blood pressure readings. Have your blood pressure checked: Every 3-5 years if you are 18-39 years of age. Every year if you are 40 years old or older. If you are between the ages of 65 and 75 and are a current or former smoker, ask your health care provider if you should have a one-time screening for abdominal aortic aneurysm (AAA). Diabetes Have regular diabetes screenings. This checks your fasting blood sugar level. Have the screening done: Once every three years after age 45 if you are at a normal weight and have a low risk for diabetes. More often and at a younger age if you are overweight or have a high risk for diabetes. What should I know about preventing infection? Hepatitis B If you have a higher risk for hepatitis   B, you should be screened for this virus. Talk with your health care provider to find out if you are at risk for hepatitis B infection. Hepatitis C Blood testing is recommended for: Everyone born from 1945 through 1965. Anyone with known risk factors for hepatitis C. Sexually transmitted infections (STIs) You should be screened each year for STIs, including gonorrhea and chlamydia,  if: You are sexually active and are younger than 48 years of age. You are older than 48 years of age and your health care provider tells you that you are at risk for this type of infection. Your sexual activity has changed since you were last screened, and you are at increased risk for chlamydia or gonorrhea. Ask your health care provider if you are at risk. Ask your health care provider about whether you are at high risk for HIV. Your health care provider may recommend a prescription medicine to help prevent HIV infection. If you choose to take medicine to prevent HIV, you should first get tested for HIV. You should then be tested every 3 months for as long as you are taking the medicine. Follow these instructions at home: Alcohol use Do not drink alcohol if your health care provider tells you not to drink. If you drink alcohol: Limit how much you have to 0-2 drinks a day. Know how much alcohol is in your drink. In the [place] one drink equals one 12 oz bottle of beer (355 mL), one 5 oz glass of wine (148 mL), or one 1 oz glass of hard liquor (44 mL). Lifestyle Do not use any products that contain nicotine or tobacco. These products include cigarettes, chewing tobacco, and vaping devices, such as e-cigarettes. If you need help quitting, ask your health care provider. Do not use street drugs. Do not share needles. Ask your health care provider for help if you need support or information about quitting drugs. General instructions Schedule regular health, dental, and eye exams. Stay current with your vaccines. Tell your health care provider if: You often feel depressed. You have ever been abused or do not feel safe at home. Summary Adopting a healthy lifestyle and getting preventive care are important in promoting health and wellness. Follow your health care provider's instructions about healthy diet, exercising, and getting tested or screened for diseases. Follow your health care provider's  instructions on monitoring your cholesterol and blood pressure. This information is not intended to replace advice given to you by your health care provider. Make sure you discuss any questions you have with your health care provider. Document Revised: 04/10/2021 Document Reviewed: 04/10/2021 Elsevier Patient Education  2023 Elsevier Inc.  

## 2022-06-01 NOTE — Assessment & Plan Note (Signed)
Reviewed with patient. If PTH>150, consider treatment.

## 2022-06-01 NOTE — Assessment & Plan Note (Signed)
Chronic, stable on simvastatin 20mg  daily. Triglycerides remain elevated - will work towards better sugar control.  The ASCVD Risk score (Arnett DK, et al., 2019) failed to calculate for the following reasons:   The valid total cholesterol range is 130 to 320 mg/dL

## 2022-06-01 NOTE — Assessment & Plan Note (Signed)
Preventative protocols reviewed and updated unless pt declined. Discussed healthy diet and lifestyle.  

## 2022-06-01 NOTE — Assessment & Plan Note (Signed)
Chronic, stable on current regimen - continue. 

## 2022-06-08 ENCOUNTER — Telehealth: Payer: Self-pay

## 2022-06-08 NOTE — Telephone Encounter (Signed)
Prior [facility] was started for Ozempic (1 MG/DOSE) 4MG /3ML pen-injectors. Rodney Ortiz (Key: [facility]) Rx #[facility]  Received message from Cover My Meds:  Your PA has been resolved, no additional PA is required.  I called [facility] to see if this prior : [id number] was approved or not.  Per Eddie at [facility], the reason we are seeing this message is because it is being refilled too soon.  It will not be available for refill until 06/26/22.  Also, per Eddie, a PA is not required for this medication.

## 2022-06-22 ENCOUNTER — Other Ambulatory Visit (INDEPENDENT_AMBULATORY_CARE_PROVIDER_SITE_OTHER): Payer: BC Managed Care – PPO

## 2022-06-22 DIAGNOSIS — Z1211 Encounter for screening for malignant neoplasm of colon: Secondary | ICD-10-CM | POA: Diagnosis not present

## 2022-06-22 LAB — FECAL OCCULT BLOOD, IMMUNOCHEMICAL: Fecal Occult Bld: NEGATIVE

## 2022-07-07 ENCOUNTER — Other Ambulatory Visit: Payer: Self-pay | Admitting: Family Medicine

## 2022-09-05 ENCOUNTER — Ambulatory Visit: Payer: BC Managed Care – PPO | Admitting: Family Medicine

## 2022-09-05 ENCOUNTER — Encounter: Payer: Self-pay | Admitting: Family Medicine

## 2022-09-05 VITALS — BP 124/84 | HR 102 | Temp 97.7°F | Ht 75.0 in | Wt 228.5 lb

## 2022-09-05 DIAGNOSIS — N183 Chronic kidney disease, stage 3 unspecified: Secondary | ICD-10-CM

## 2022-09-05 DIAGNOSIS — E1121 Type 2 diabetes mellitus with diabetic nephropathy: Secondary | ICD-10-CM

## 2022-09-05 DIAGNOSIS — E663 Overweight: Secondary | ICD-10-CM

## 2022-09-05 DIAGNOSIS — E1122 Type 2 diabetes mellitus with diabetic chronic kidney disease: Secondary | ICD-10-CM

## 2022-09-05 DIAGNOSIS — Z23 Encounter for immunization: Secondary | ICD-10-CM

## 2022-09-05 LAB — POCT GLYCOSYLATED HEMOGLOBIN (HGB A1C): Hemoglobin A1C: 6.8 % — AB (ref 4.0–5.6)

## 2022-09-05 NOTE — Progress Notes (Signed)
Patient ID: Rodney Ortiz, male    DOB: [birth year], 46 y.o.   MRN: [id number]  This visit was conducted in person.  BP 124/84   Pulse (!) 102   Temp 97.7 F (36.5 C) (Temporal)   Ht $R'6\' 3"'Ml$  (1.905 m)   Wt 228 lb 8 oz (103.6 kg)   SpO2 98%   BMI 28.56 kg/m    CC: 3 mo dm f/u visit  Subjective:   HPI: Rodney Ortiz is a 46 y.o. male presenting on 09/05/2022 for Diabetes (Here for 3 mo f/u.)   CKD stage 3-4 - he is on farxiga $RemoveBe'10mg'YDYdkGftb$  daily and valsartan $RemoveBefor'320mg'lmJgwCqcCmpR$  daily. Last visit we referred to nephrologist 08/2022 - established with Dr Hollie Salk at [facility] 08/2022. Planned 6 mo f/u visit. Goal BP <130/80, goal A1c <7%.   DM - does regularly check sugars fasting. Compliant with antihyperglycemic regimen which includes: farxiga $RemoveBeforeDEI'10mg'udQTCHSBMFnRzCQC$  daily, ozempic $RemoveBefore'1mg'AggTpQjTPxdsw$  weekly. No longer on basaglar. Denies low sugars or hypoglycemic symptoms. Denies paresthesias, blurry vision. Last diabetic eye exam 11/2020 - DUE - upcoming appt end of year. Glucometer brand: one touch ultra mini. Last foot exam: 01/2022. DSME: at [facility] 2018. Lab Results  Component Value Date   HGBA1C 6.8 (A) 09/05/2022   Diabetic Foot Exam - Simple   No data filed    Lab Results  Component Value Date   MICROALBUR 74.2 (H) 05/25/2022         Relevant past medical, surgical, family and social history reviewed and updated as indicated. Interim medical history since our last visit reviewed. Allergies and medications reviewed and updated. Outpatient Medications Prior to Visit  Medication Sig Dispense Refill   Blood Glucose Monitoring Suppl (ONE TOUCH ULTRA MINI) w/Device KIT 1 kit by Does not apply route as directed. 1 each 0   carvedilol (COREG) 25 MG tablet Take 1 tablet (25 mg total) by mouth 2 (two) times daily with a meal. 180 tablet 3   Cholecalciferol (VITAMIN D) 50 MCG (2000 UT) CAPS Take 1 capsule (2,000 Units total) by mouth daily. 30 capsule    dapagliflozin propanediol (FARXIGA) 10 MG TABS tablet Take 1 tablet  (10 mg total) by mouth daily before breakfast. 90 tablet 3   fluticasone (FLONASE) 50 MCG/ACT nasal spray Place 1 spray into both nostrils daily. 16 g 3   glucose blood (ONE TOUCH ULTRA TEST) test strip Use as instructed to check sugars daily and as needed 100 each 3   Insulin Pen Needle (BD PEN NEEDLE NANO 2ND GEN) 32G X 4 MM MISC Use to inject insulin daily 100 each 3   ONETOUCH DELICA LANCETS 58I MISC Check blood sugar daily and as directed or needed. 100 each 2   OZEMPIC, 1 MG/DOSE, 4 MG/3ML SOPN INJECT 1 MG INTO THE SKIN ONCE A WEEK 3 mL 3   simvastatin (ZOCOR) 20 MG tablet Take 1 tablet (20 mg total) by mouth at bedtime. 90 tablet 3   valsartan (DIOVAN) 320 MG tablet Take 1 tablet (320 mg total) by mouth daily. 90 tablet 3   No facility-administered medications prior to visit.     Per HPI unless specifically indicated in ROS section below Review of Systems  Objective:  BP 124/84   Pulse (!) 102   Temp 97.7 F (36.5 C) (Temporal)   Ht $R'6\' 3"'ke$  (1.905 m)   Wt 228 lb 8 oz (103.6 kg)   SpO2 98%   BMI 28.56 kg/m   Wt Readings from Last  3 Encounters:  09/05/22 228 lb 8 oz (103.6 kg)  06/01/22 233 lb (105.7 kg)  02/09/22 235 lb 4 oz (106.7 kg)      Physical Exam Vitals and nursing note reviewed.  Constitutional:      Appearance: Normal appearance. He is not ill-appearing.  Eyes:     Extraocular Movements: Extraocular movements intact.     Conjunctiva/sclera: Conjunctivae normal.     Pupils: Pupils are equal, round, and reactive to light.  Cardiovascular:     Rate and Rhythm: Normal rate and regular rhythm.     Pulses: Normal pulses.     Heart sounds: Normal heart sounds. No murmur heard. Pulmonary:     Effort: Pulmonary effort is normal. No respiratory distress.     Breath sounds: Normal breath sounds. No wheezing, rhonchi or rales.  Musculoskeletal:     Right lower leg: No edema.     Left lower leg: No edema.     Comments: See HPI for foot exam if done  Skin:     General: Skin is warm and dry.     Findings: No rash.  Neurological:     Mental Status: He is alert.  Psychiatric:        Mood and Affect: Mood normal.        Behavior: Behavior normal.       Results for orders placed or performed in visit on 09/05/22  POCT glycosylated hemoglobin (Hb A1C)  Result Value Ref Range   Hemoglobin A1C 6.8 (A) 4.0 - 5.6 %   HbA1c POC (<> result, manual entry)     HbA1c, POC (prediabetic range)     HbA1c, POC (controlled diabetic range)     Lab Results  Component Value Date   CREATININE 2.53 (H) 05/25/2022   BUN 30 (H) 05/25/2022   NA 139 05/25/2022   K 4.4 05/25/2022   CL 106 05/25/2022   CO2 25 05/25/2022   Assessment & Plan:   Problem List Items Addressed This Visit     CKD stage 3 due to type 2 diabetes mellitus ([place])    Established with Dr Hollie Salk - appreciate renal care.       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  Discussed continuous glucose monitor - I think he would be good candidate for this. I asked him to return to see Mendel Ryder our pharmacist for CGM teaching.       Relevant Orders   POCT glycosylated hemoglobin (Hb A1C) (Completed)   Overweight with body mass index (BMI) 25.0-29.9    5 lb weight loss since last seen. He is motivated to continue healthy diet and lifestyle choices.       Other Visit Diagnoses     Need for influenza vaccination       Relevant Orders   Flu Vaccine QUAD 17mo+IM (Fluarix, Fluzone & Alfiuria Quad PF) (Completed)        No orders of the defined types were placed in this encounter.  Orders Placed This Encounter  Procedures   Flu Vaccine QUAD 49mo+IM (Fluarix, Fluzone & Alfiuria Quad PF)   POCT glycosylated hemoglobin (Hb A1C)    Patient Instructions  Flu shot today  We will request latest diabetic eye exam from Dr Madilyn Hook at [facility] in [place] (mall).   Good to see you today Return as needed or in 1 year for next physical.  Schedule visit with our pharmacist for continuous glucose monitor teaching.   Follow up plan: Return in about 4 months (around 01/06/2023) for follow up visit.  Ria Bush, MD

## 2022-09-05 NOTE — Patient Instructions (Addendum)
Flu shot today  We will request latest diabetic eye exam from Dr Madilyn Hook at [facility] in [place] (mall).  Good to see you today Return as needed or in 1 year for next physical.  Schedule visit with our pharmacist for continuous glucose monitor teaching.

## 2022-09-05 NOTE — Assessment & Plan Note (Signed)
Established with Dr Hollie Salk - appreciate renal care.

## 2022-09-05 NOTE — Assessment & Plan Note (Addendum)
5 lb weight loss since last seen. He is motivated to continue healthy diet and lifestyle choices.

## 2022-09-05 NOTE — Assessment & Plan Note (Addendum)
Chronic, improved control on current regimen of farxiga and ozempic - continue.  Discussed continuous glucose monitor - I think he would be good candidate for this. I asked him to return to see Mendel Ryder our pharmacist for CGM teaching.

## 2022-09-10 ENCOUNTER — Telehealth: Payer: Self-pay | Admitting: Pharmacist

## 2022-09-10 DIAGNOSIS — E1121 Type 2 diabetes mellitus with diabetic nephropathy: Secondary | ICD-10-CM

## 2022-09-10 MED ORDER — FREESTYLE LIBRE 3 SENSOR MISC: 3 refills | Status: DC

## 2022-09-10 NOTE — Telephone Encounter (Signed)
Contacted patient to discuss CGM. Patient has a smart phone and will be able to use Freestyle Libre 3 sensor with that. Ordered sensor to mail order pharmacy per patient preference - per covermymeds it does not require a PA.  Scheduled CGM training appt for 10/11. Freestyle Libre 3 sensor sample available.

## 2022-09-12 ENCOUNTER — Ambulatory Visit: Payer: BC Managed Care – PPO | Admitting: Pharmacist

## 2022-09-14 ENCOUNTER — Other Ambulatory Visit: Payer: Self-pay | Admitting: Family Medicine

## 2022-09-14 DIAGNOSIS — E1121 Type 2 diabetes mellitus with diabetic nephropathy: Secondary | ICD-10-CM

## 2022-09-14 MED ORDER — FREESTYLE LIBRE 2 SENSOR MISC: 1 refills | Status: DC

## 2022-09-14 NOTE — Telephone Encounter (Signed)
Patient has not yet received Freestyle Libre 3 sensors from pharmacy. Pharmacy team will reach out to pharmacy to determine if PA is needed.

## 2022-09-14 NOTE — Telephone Encounter (Signed)
Patient came to office for CGM training 09/12/22.  Patient presented for [facility] 2 training.  Demonstrated with teach-back: -components of sensor and how to prepare sensor for application -how to apply sensor to arm. Patient successfully applied sensor to L arm today -how to navigate [employer] app in order to pair sensor; sensor was successfully paired today -how to adjust high/low sugar alarms. Low alarm set to 70 and high alarm set to 250 today. -App users: demonstrated navigating various components including settings, logbook, and daily patterns  Educated patient on: -scanning sensor at least every 8 hours to ensure no gaps in data -changing sensor every 14 days or as initiated by app/reader -avoiding high doses of Vitamin C > 500 mg/day -removing sensor prior to MRI, CT scans -if sugar readings ever seem wrong/questionable, check sugar with a finger stick -Libreview cloud monitoring: Patient did not enroll in [place]  Provided customer service line for [employer] in case of sensor breakdowns: [phone]   Pt voiced understanding of above and denies further questions.

## 2022-09-14 NOTE — Patient Instructions (Signed)
Today we placed the Freestyle Libre 3 continuous glucose monitor (CGM) on your arm. It will track your sugar for you with new readings every 1-5 minutes.  Important points to remember about your CGM: -Any time the sugar reading does not reflect how you feel (particularly if it is telling you a very low reading), double check with a finger stick -The sensor is measuring the sugar in your skin, and a finger stick measures the sugar in your blood, and these numbers will probably never be exactly the same. This is normal. -The system will prompt you to change the sensor after approximately 14 days. -The sensor has to be removed prior to CT scans or MRIs. Try not to apply a new sensor if you know you have a scan coming up. -If the sensor falls off early, it cannot be re-applied. A new sensor should be applied.  With any sensor malfunctions, call Freestyle: [phone]

## 2022-09-14 NOTE — Progress Notes (Unsigned)
Patient presented for [facility] 2 training.  Demonstrated with teach-back: -components of sensor and how to prepare sensor for application -how to apply sensor to arm. Patient successfully applied sensor to L arm today -how to navigate [employer] app in order to pair sensor; sensor was successfully paired today -how to adjust high/low sugar alarms. Low alarm set to 70 and high alarm set to 250 today. -App users: demonstrated navigating various components including settings, logbook, and daily patterns  Educated patient on: -scanning sensor at least every 8 hours to ensure no gaps in data -changing sensor every 14 days or as initiated by app/reader -avoiding high doses of Vitamin C > 500 mg/day -removing sensor prior to MRI, CT scans -if sugar readings ever seem wrong/questionable, check sugar with a finger stick -Libreview cloud monitoring: Patient did not enroll in [place]  Provided customer service line for [employer] in case of sensor breakdowns: [phone]   Pt voiced understanding of above and denies further questions.   Charlene Brooke, PharmD, BCACP Clinical Pharmacist [facility] at [facility] [phone]

## 2022-09-14 NOTE — Addendum Note (Signed)
Addended by: Charlton Haws on: 09/14/2022 04:31 PM   Modules accepted: Orders

## 2022-09-14 NOTE — Telephone Encounter (Signed)
Per covermymeds - Freestyle Libre 3 is not covered, but Libre 2 does not require a PA. Will order [place] 2 instead.

## 2022-09-14 NOTE — Telephone Encounter (Signed)
Hi -  if you see the PA for this Freestyle Libre come through, feel free to pass on to me!

## 2022-09-14 NOTE — Telephone Encounter (Signed)
Contacted [facility] regarding FreeStyle Libre 3 and PA is required.Confirmed [place] office fax number,form to be faxed.  Charlene Brooke, CPP notified  Avel Sensor, [place]  [phone]

## 2022-09-18 MED ORDER — DEXCOM G6 TRANSMITTER MISC: 3 refills | Status: DC

## 2022-09-18 NOTE — Telephone Encounter (Signed)
Per [place] [employer] 2 is not covered either (despite response through covermymeds that said it was). [facility] requests switch to Dexcom G6. Sent Rx for sensor and transmitter. Patient would need to be trained on Dexcom if this is approved.

## 2022-09-19 ENCOUNTER — Telehealth: Payer: Self-pay

## 2022-09-19 NOTE — Telephone Encounter (Signed)
We received prior auth paperwork for both Dexcom G6 Sensor and Transmitter.  These forms have been placed in your inbox for completion.

## 2022-09-19 NOTE — Telephone Encounter (Signed)
Signed, will route to [place]. Thanks!

## 2022-09-24 NOTE — Telephone Encounter (Signed)
Noted. Thank you for reaching out to patient and for getting him the CGM sample.

## 2022-09-24 NOTE — Telephone Encounter (Signed)
All CGM has been denied by patient's insurance - they will only cover it if he is on insulin. Called patient to inform him, he will not be able to continue on CGM but appreciates the 2-week sample he's been wearing. He believes he has seen what he needed to see about his glucose patterns, and he shared his CGM data via Libreview to share with PCP:  Reviewed AGP report: 09/11/22 to 09/24/22. Sensor active: 88%  Time in range (70-180): 90% (goal > 70%)  High (>180): 10%  Low (< 70): 0% (goal < 4%)  GMI: 6.6%; Average glucose: 136

## 2022-09-24 NOTE — Addendum Note (Signed)
Addended by: Charlton Haws on: 09/24/2022 03:43 PM   Modules accepted: Orders

## 2022-10-12 LAB — HM DIABETES EYE EXAM

## 2022-10-28 ENCOUNTER — Other Ambulatory Visit: Payer: Self-pay | Admitting: Family Medicine

## 2023-01-08 ENCOUNTER — Encounter: Payer: Self-pay | Admitting: Family Medicine

## 2023-01-08 ENCOUNTER — Ambulatory Visit: Payer: BC Managed Care – PPO | Admitting: Family Medicine

## 2023-01-08 VITALS — BP 146/104 | HR 94 | Temp 97.2°F | Ht 75.0 in | Wt 227.4 lb

## 2023-01-08 DIAGNOSIS — I10 Essential (primary) hypertension: Secondary | ICD-10-CM

## 2023-01-08 DIAGNOSIS — N184 Chronic kidney disease, stage 4 (severe): Secondary | ICD-10-CM | POA: Diagnosis not present

## 2023-01-08 DIAGNOSIS — E1121 Type 2 diabetes mellitus with diabetic nephropathy: Secondary | ICD-10-CM

## 2023-01-08 DIAGNOSIS — E1122 Type 2 diabetes mellitus with diabetic chronic kidney disease: Secondary | ICD-10-CM

## 2023-01-08 LAB — POCT GLYCOSYLATED HEMOGLOBIN (HGB A1C): Hemoglobin A1C: 7.3 % — AB (ref 4.0–5.6)

## 2023-01-08 MED ORDER — SEMAGLUTIDE (2 MG/DOSE) 8 MG/3ML ~~LOC~~ SOPN: 2.0000 mg | PEN_INJECTOR | SUBCUTANEOUS | 6 refills | Status: DC

## 2023-01-08 NOTE — Patient Instructions (Addendum)
Sugars are slightly above goal with A1c 7.2% (goal <7). Increase ozempic to 2mg  weekly, watch for side effects. 1 month supply sent locally. Let me know how you tolerate higher dose.  Blood pressure  was too high today. Your goal blood pressure is <130/80. Start monitoring at home, keep log and drop off at your convenience.  Work on low salt/sodium diet - goal <2gm (2,000mg ) per day. Eat a diet high in fruits/vegetables and whole grains. Look into mediterranean and DASH diet. Goal activity is 134min/wk of moderate intensity exercise.  This can be split into 30 minute chunks.  If you are not at this level, you can start with smaller 10-15 min increments and slowly build up activity. Look at [email or web address] for more resources  Return in 5 months for physical.

## 2023-01-08 NOTE — Assessment & Plan Note (Addendum)
Chronic, A1c 7.3% mildly above goal.  Continue farxiga 10mg  daily, will increase ozempic to 2mg  weekly. Sent in 1 month supply locally while we ensure he will tolerate higher dose - reviewing side effects to monitor for including pancreatitis.

## 2023-01-08 NOTE — Assessment & Plan Note (Signed)
Chronic, deteriorated. Rec limit salt, increase aerobic exercise, I asked him to monitor BP readings at home and bring me log (log provided). Goal BP <130/80 in CKD.

## 2023-01-08 NOTE — Assessment & Plan Note (Signed)
Established with Dr Hollie Salk nephrology last year - appreciate her care, appt at end of month, I asked him to take home BP readings to upcoming appt.

## 2023-01-08 NOTE — Progress Notes (Addendum)
Patient ID: Rodney Ortiz, male    DOB: 22-Nov-1976, 47 y.o.   MRN: [id number]  This visit was conducted in person.  BP (!) 146/104 (BP Location: Right Arm, Cuff Size: Large)   Pulse 94   Temp (!) 97.2 F (36.2 C) (Temporal)   Ht 6\' 3"  (1.905 m)   Wt 227 lb 6 oz (103.1 kg)   SpO2 98%   BMI 28.42 kg/m   BP Readings from Last 3 Encounters:  01/08/23 (!) 146/104  09/05/22 124/84  06/01/22 126/80    CC: 4 mo DM f/u visit  Subjective:   HPI: Rodney Ortiz is a 47 y.o. male presenting on 01/08/2023 for Medical Management of Chronic Issues (Here for 4 mo DM f/u.)   CKD stage 4 - established with nephrology Dr Hollie Salk. Upcoming appt an end of month  HTN - Compliant with current antihypertensive regimen of carvedilol 25mg  bid, valsartan 320mg  daily. Does not check blood pressures at home, but does have cuff. No low blood pressure readings or symptoms of dizziness/syncope. Denies HA, vision changes, CP/tightness, SOB, leg swelling.   DM - does not regularly check sugars. Insurance didn't cover CGM. Compliant with antihyperglycemic regimen which includes: farxiga 10mg  daily, ozempic 1mg  weekly. Tolerating well without side effects. Occ hypoglycemic symptoms in am. Denies paresthesias, blurry vision. Last diabetic eye exam [facility] 10/2022 - records requested. Glucometer brand: one touch ultra mini. Last foot exam: 01/2022 - rpt today. DSME: [employer] [facility]. Lab Results  Component Value Date   HGBA1C 7.3 (A) 01/08/2023   Diabetic Foot Exam - Simple   Simple Foot Form Diabetic Foot exam was performed with the following findings: Yes 01/08/2023  3:19 PM  Visual Inspection See comments: Yes Sensation Testing Intact to touch and monofilament testing bilaterally: Yes Pulse Check Posterior Tibialis and Dorsalis pulse intact bilaterally: Yes Comments Maceration between R 4th/5th toes    Lab Results  Component Value Date   MICROALBUR 74.2 (H) 05/25/2022       Relevant past medical,  surgical, family and social history reviewed and updated as indicated. Interim medical history since our last visit reviewed. Allergies and medications reviewed and updated. Outpatient Medications Prior to Visit  Medication Sig Dispense Refill   Blood Glucose Monitoring Suppl (ONE TOUCH ULTRA MINI) w/Device KIT 1 kit by Does not apply route as directed. 1 each 0   carvedilol (COREG) 25 MG tablet Take 1 tablet (25 mg total) by mouth 2 (two) times daily with a meal. 180 tablet 3   Cholecalciferol (VITAMIN D) 50 MCG (2000 UT) CAPS Take 1 capsule (2,000 Units total) by mouth daily. 30 capsule    dapagliflozin propanediol (FARXIGA) 10 MG TABS tablet Take 1 tablet (10 mg total) by mouth daily before breakfast. 90 tablet 3   fluticasone (FLONASE) 50 MCG/ACT nasal spray Place 1 spray into both nostrils daily. 16 g 3   glucose blood (ONE TOUCH ULTRA TEST) test strip Use as instructed to check sugars daily and as needed 100 each 3   Insulin Pen Needle (BD PEN NEEDLE NANO 2ND GEN) 32G X 4 MM MISC Use to inject insulin daily 100 each 3   ONETOUCH DELICA LANCETS 37T MISC Check blood sugar daily and as directed or needed. 100 each 2   simvastatin (ZOCOR) 20 MG tablet Take 1 tablet (20 mg total) by mouth at bedtime. 90 tablet 3   valsartan (DIOVAN) 320 MG tablet Take 1 tablet (320 mg total) by mouth daily.  90 tablet 3   Semaglutide, 1 MG/DOSE, (OZEMPIC, 1 MG/DOSE,) 4 MG/3ML SOPN INJECT 1 MG INTO SKIN WEEKLY 9 mL 2   No facility-administered medications prior to visit.     Per HPI unless specifically indicated in ROS section below Review of Systems  Objective:  BP (!) 146/104 (BP Location: Right Arm, Cuff Size: Large)   Pulse 94   Temp (!) 97.2 F (36.2 C) (Temporal)   Ht 6\' 3"  (1.905 m)   Wt 227 lb 6 oz (103.1 kg)   SpO2 98%   BMI 28.42 kg/m   Wt Readings from Last 3 Encounters:  01/08/23 227 lb 6 oz (103.1 kg)  09/05/22 228 lb 8 oz (103.6 kg)  06/01/22 233 lb (105.7 kg)      Physical  Exam Vitals and nursing note reviewed.  Constitutional:      Appearance: Normal appearance. He is not ill-appearing.  Eyes:     Extraocular Movements: Extraocular movements intact.     Conjunctiva/sclera: Conjunctivae normal.     Pupils: Pupils are equal, round, and reactive to light.  Cardiovascular:     Rate and Rhythm: Normal rate and regular rhythm.     Pulses: Normal pulses.     Heart sounds: Normal heart sounds. No murmur heard. Pulmonary:     Effort: Pulmonary effort is normal. No respiratory distress.     Breath sounds: Normal breath sounds. No wheezing, rhonchi or rales.  Musculoskeletal:     Right lower leg: No edema.     Left lower leg: No edema.     Comments: See HPI for foot exam if done  Skin:    General: Skin is warm and dry.     Findings: No rash.  Neurological:     Mental Status: He is alert.  Psychiatric:        Mood and Affect: Mood normal.        Behavior: Behavior normal.       Results for orders placed or performed in visit on 01/08/23  POCT glycosylated hemoglobin (Hb A1C)  Result Value Ref Range   Hemoglobin A1C 7.3 (A) 4.0 - 5.6 %   HbA1c POC (<> result, manual entry)     HbA1c, POC (prediabetic range)     HbA1c, POC (controlled diabetic range)     Lab Results  Component Value Date   CREATININE 2.53 (H) 05/25/2022   BUN 30 (H) 05/25/2022   NA 139 05/25/2022   K 4.4 05/25/2022   CL 106 05/25/2022   CO2 25 05/25/2022    Assessment & Plan:   Problem List Items Addressed This Visit     Essential hypertension    Chronic, deteriorated. Rec limit salt, increase aerobic exercise, I asked him to monitor BP readings at home and bring me log (log provided). Goal BP <130/80 in CKD.       CKD stage 4 due to type 2 diabetes mellitus [facility])    Established with Dr Hollie Salk nephrology last year - appreciate her care, appt at end of month, I asked him to take home BP readings to upcoming appt.       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  Continue farxiga 10mg  daily, will increase ozempic to 2mg  weekly. Sent in 1 month supply locally while we ensure he will tolerate higher dose - reviewing side effects  to monitor for including pancreatitis.      Relevant Medications   Semaglutide, 2 MG/DOSE, 8 MG/3ML SOPN   Other Relevant Orders   POCT glycosylated hemoglobin (Hb A1C) (Completed)     Meds ordered this encounter  Medications   Semaglutide, 2 MG/DOSE, 8 MG/3ML SOPN    Sig: Inject 2 mg as directed once a week.    Dispense:  3 mL    Refill:  6    Orders Placed This Encounter  Procedures   POCT glycosylated hemoglobin (Hb A1C)    Patient Instructions  Sugars are slightly above goal with A1c 7.2% (goal <7). Increase ozempic to 2mg  weekly, watch for side effects. 1 month supply sent locally. Let me know how you tolerate higher dose.  Blood pressure  was too high today. Your goal blood pressure is <130/80. Start monitoring at home, keep log and drop off at your convenience.  Work on low salt/sodium diet - goal <2gm (2,000mg ) per day. Eat a diet high in fruits/vegetables and whole grains. Look into mediterranean and DASH diet. Goal activity is 138min/wk of moderate intensity exercise.  This can be split into 30 minute chunks.  If you are not at this level, you can start with smaller 10-15 min increments and slowly build up activity. Look at [employer] for more resources  Return in 5 months for physical.   Follow up plan: Return in about 5 months (around 06/08/2023) for annual exam, prior fasting for blood work.  Ria Bush, MD

## 2023-02-01 LAB — LAB REPORT - SCANNED
Albumin, Urine POC: 395.5
Creatinine, POC: 125.2 mg/dL
EGFR: 34
Microalb Creat Ratio: 316

## 2023-05-18 ENCOUNTER — Other Ambulatory Visit: Payer: Self-pay | Admitting: Family Medicine

## 2023-05-18 DIAGNOSIS — E785 Hyperlipidemia, unspecified: Secondary | ICD-10-CM

## 2023-05-18 DIAGNOSIS — I10 Essential (primary) hypertension: Secondary | ICD-10-CM

## 2023-05-18 DIAGNOSIS — E1121 Type 2 diabetes mellitus with diabetic nephropathy: Secondary | ICD-10-CM

## 2023-06-05 ENCOUNTER — Ambulatory Visit (INDEPENDENT_AMBULATORY_CARE_PROVIDER_SITE_OTHER): Payer: BC Managed Care – PPO | Admitting: Family Medicine

## 2023-06-05 VITALS — BP 128/88 | HR 100 | Temp 97.3°F | Ht 74.0 in | Wt 223.1 lb

## 2023-06-05 DIAGNOSIS — Z0000 Encounter for general adult medical examination without abnormal findings: Secondary | ICD-10-CM | POA: Diagnosis not present

## 2023-06-05 DIAGNOSIS — Z0001 Encounter for general adult medical examination with abnormal findings: Secondary | ICD-10-CM

## 2023-06-05 DIAGNOSIS — E785 Hyperlipidemia, unspecified: Secondary | ICD-10-CM

## 2023-06-05 DIAGNOSIS — N184 Chronic kidney disease, stage 4 (severe): Secondary | ICD-10-CM

## 2023-06-05 DIAGNOSIS — N2581 Secondary hyperparathyroidism of renal origin: Secondary | ICD-10-CM

## 2023-06-05 DIAGNOSIS — E1121 Type 2 diabetes mellitus with diabetic nephropathy: Secondary | ICD-10-CM

## 2023-06-05 DIAGNOSIS — I10 Essential (primary) hypertension: Secondary | ICD-10-CM | POA: Diagnosis not present

## 2023-06-05 DIAGNOSIS — E663 Overweight: Secondary | ICD-10-CM

## 2023-06-05 DIAGNOSIS — K760 Fatty (change of) liver, not elsewhere classified: Secondary | ICD-10-CM

## 2023-06-05 DIAGNOSIS — E1122 Type 2 diabetes mellitus with diabetic chronic kidney disease: Secondary | ICD-10-CM | POA: Diagnosis not present

## 2023-06-05 DIAGNOSIS — Z1211 Encounter for screening for malignant neoplasm of colon: Secondary | ICD-10-CM

## 2023-06-05 DIAGNOSIS — E1169 Type 2 diabetes mellitus with other specified complication: Secondary | ICD-10-CM

## 2023-06-05 DIAGNOSIS — D696 Thrombocytopenia, unspecified: Secondary | ICD-10-CM

## 2023-06-05 DIAGNOSIS — I119 Hypertensive heart disease without heart failure: Secondary | ICD-10-CM

## 2023-06-05 LAB — CBC WITH DIFFERENTIAL/PLATELET
Basophils Relative: 0.4 %
Hemoglobin: 11.8 g/dL — ABNORMAL LOW (ref 13.2–17.1)
Lymphs Abs: 1372 cells/uL (ref 850–3900)
MCH: 26.2 pg — ABNORMAL LOW (ref 27.0–33.0)
MCV: 81.6 fL (ref 80.0–100.0)
Neutrophils Relative %: 57.8 %
Total Lymphocyte: 28 %

## 2023-06-05 MED ORDER — SIMVASTATIN 20 MG PO TABS: 20.0000 mg | ORAL_TABLET | Freq: Every day | ORAL | 4 refills | Status: DC

## 2023-06-05 MED ORDER — SEMAGLUTIDE (2 MG/DOSE) 8 MG/3ML ~~LOC~~ SOPN: 2.0000 mg | PEN_INJECTOR | SUBCUTANEOUS | 3 refills | Status: DC

## 2023-06-05 MED ORDER — VALSARTAN 320 MG PO TABS: 320.0000 mg | ORAL_TABLET | Freq: Every day | ORAL | 4 refills | Status: DC

## 2023-06-05 MED ORDER — DAPAGLIFLOZIN PROPANEDIOL 10 MG PO TABS: ORAL_TABLET | ORAL | 4 refills | Status: DC

## 2023-06-05 MED ORDER — FLUTICASONE PROPIONATE 50 MCG/ACT NA SUSP: 1.0000 | Freq: Every day | NASAL | 3 refills | Status: DC

## 2023-06-05 MED ORDER — CARVEDILOL 25 MG PO TABS: 25.0000 mg | ORAL_TABLET | Freq: Two times a day (BID) | ORAL | 4 refills | Status: DC

## 2023-06-05 NOTE — Patient Instructions (Addendum)
EKG today  Pass by lab to pick up stool kit Labwork today  Sleep questionnaire today.  We will request latest eye exam from Dr Seymour Bars at [facility] in [place] [phone], fax [phone] Good to see you today  Return in 6 months for diabetes and hypertension follow up visit

## 2023-06-05 NOTE — Assessment & Plan Note (Addendum)
Appreciate nephrology care seeing Q6mo, goal BP <130/80.  Thought diabetic and hypertensive nephropathy.

## 2023-06-05 NOTE — Assessment & Plan Note (Signed)
Ongoing weight loss noted - continue ozempic and healthy diet/lifestyle choices.

## 2023-06-05 NOTE — Assessment & Plan Note (Signed)
Update LFT's 

## 2023-06-05 NOTE — Assessment & Plan Note (Signed)
Preventative protocols reviewed and updated unless pt declined. Discussed healthy diet and lifestyle.  

## 2023-06-05 NOTE — Assessment & Plan Note (Signed)
Update PTH.

## 2023-06-05 NOTE — Assessment & Plan Note (Signed)
Update CBC. 

## 2023-06-05 NOTE — Assessment & Plan Note (Addendum)
Chronic, update A1c on farxiga 10mg  daily and ozempic 2mg  weekly.  Tolerating meds well.

## 2023-06-05 NOTE — Progress Notes (Signed)
Ph: [phone] Fax: [phone]   Patient ID: Rodney Ortiz, male    DOB: [birth year], 47 y.o.   MRN: [id number]  This visit was conducted in person.  BP 128/88 (BP Location: Right Arm, Cuff Size: Large)   Pulse 100   Temp (!) 97.3 F (36.3 C) (Temporal)   Ht 6\' 2"  (1.88 m)   Wt 223 lb 2 oz (101.2 kg)   SpO2 99%   BMI 28.65 kg/m   BP Readings from Last 3 Encounters:  06/05/23 128/88  01/08/23 (!) 146/104  09/05/22 124/84   CC: CPE  Subjective:   HPI: Rodney Ortiz is a 47 y.o. male presenting on 06/05/2023 for Annual Exam   Recently moved to [place].   CKD stage 4 - established with Dr Signe Colt. Last saw 01/2022, started on spironolactone 25mg  daily - tolerating well.  Known diabetes and hypertension on carvedilol 25mg  bid, farxiga 10mg  daily, ozempic 2mg  weekly, valsartan 320mg  daily. Also on simvastatin 20mg  nightly.  Goal BP <130/80.   BP elevated on arrival - coming in straight from work, had stressful work conversation prior to appt.  Not checking BP or sugars at home recently - recent move - glucometer/BP cuff still in boxes.   H/o mild LVH by last echo 2011, EF 50-55%, normal wall motion, G2DD.   Preventative: Colon cancer screening - yearly iFOB.  Prostate cancer screening - no known fmhx prostate cancer. PSA yearly. Nocturia x1-2.  Flu shot yearly  COVID vaccine 01/2020, 02/2020  Tdap 2015 Pneumovax-23 2015  Seat belt use discussed  Sunscreen use discussed. No changing moles on skin.  Sleep - averaging 8 hours/night, good restorative sleep, no daytime sleepiness, snoring. ESS today = 1.  Non smoker  Alcohol - none  Dentist - due  Eye exam - yearly   Lives with wife Rodney Ortiz) Occ: Tour manager in [employer]  Activity: limited - walking, enjoys hiking in mountains  Diet: good water, fruits/vegetables daily      Relevant past medical, surgical, family and social history reviewed and updated as indicated. Interim medical history since our last visit  reviewed. Allergies and medications reviewed and updated. Outpatient Medications Prior to Visit  Medication Sig Dispense Refill   Blood Glucose Monitoring Suppl (ONE TOUCH ULTRA MINI) w/Device KIT 1 kit by Does not apply route as directed. 1 each 0   Cholecalciferol (VITAMIN D) 50 MCG (2000 UT) CAPS Take 1 capsule (2,000 Units total) by mouth daily. 30 capsule    glucose blood (ONE TOUCH ULTRA TEST) test strip Use as instructed to check sugars daily and as needed 100 each 3   ONETOUCH DELICA LANCETS 33G MISC Check blood sugar daily and as directed or needed. 100 each 2   spironolactone (ALDACTONE) 25 MG tablet Take 1 tablet (25 mg total) by mouth daily.     carvedilol (COREG) 25 MG tablet TAKE 1 TABLET TWICE DAILY  WITH MEALS 180 tablet 0   dapagliflozin propanediol (FARXIGA) 10 MG TABS tablet TAKE 1 TABLET DAILY BEFORE BREAKFAST. NOTE NEW DOSE. 90 tablet 0   fluticasone (FLONASE) 50 MCG/ACT nasal spray Place 1 spray into both nostrils daily. 16 g 3   Semaglutide, 2 MG/DOSE, 8 MG/3ML SOPN Inject 2 mg as directed once a week. 3 mL 6   simvastatin (ZOCOR) 20 MG tablet TAKE 1 TABLET AT BEDTIME 90 tablet 0   valsartan (DIOVAN) 320 MG tablet TAKE 1 TABLET DAILY 90 tablet 0   Insulin Pen Needle (BD PEN NEEDLE  NANO 2ND GEN) 32G X 4 MM MISC Use to inject insulin daily 100 each 3   No facility-administered medications prior to visit.     Per HPI unless specifically indicated in ROS section below Review of Systems  Constitutional:  Negative for activity change, appetite change, chills, fatigue, fever and unexpected weight change.  HENT:  Negative for hearing loss.   Eyes:  Negative for visual disturbance.  Respiratory:  Positive for cough (am cough). Negative for chest tightness, shortness of breath and wheezing.   Cardiovascular:  Negative for chest pain, palpitations and leg swelling.  Gastrointestinal:  Negative for abdominal distention, abdominal pain, blood in stool, constipation, diarrhea,  nausea and vomiting.  Genitourinary:  Negative for difficulty urinating and hematuria.  Musculoskeletal:  Negative for arthralgias, myalgias and neck pain.  Skin:  Negative for rash.  Neurological:  Negative for dizziness, seizures, syncope and headaches.  Hematological:  Negative for adenopathy. Does not bruise/bleed easily.  Psychiatric/Behavioral:  Negative for dysphoric mood. The patient is not nervous/anxious.     Objective:  BP 128/88 (BP Location: Right Arm, Cuff Size: Large)   Pulse 100   Temp (!) 97.3 F (36.3 C) (Temporal)   Ht 6\' 2"  (1.88 m)   Wt 223 lb 2 oz (101.2 kg)   SpO2 99%   BMI 28.65 kg/m   Wt Readings from Last 3 Encounters:  06/05/23 223 lb 2 oz (101.2 kg)  01/08/23 227 lb 6 oz (103.1 kg)  09/05/22 228 lb 8 oz (103.6 kg)      Physical Exam Vitals and nursing note reviewed.  Constitutional:      General: He is not in acute distress.    Appearance: Normal appearance. He is well-developed. He is not ill-appearing.  HENT:     Head: Normocephalic and atraumatic.     Right Ear: Hearing, tympanic membrane, ear canal and external ear normal.     Left Ear: Hearing, tympanic membrane, ear canal and external ear normal.     Nose: Nose normal.     Mouth/Throat:     Mouth: Mucous membranes are moist.     Pharynx: Oropharynx is clear. No oropharyngeal exudate or posterior oropharyngeal erythema.  Eyes:     General: No scleral icterus.    Extraocular Movements: Extraocular movements intact.     Conjunctiva/sclera: Conjunctivae normal.     Pupils: Pupils are equal, round, and reactive to light.  Neck:     Thyroid: No thyroid mass or thyromegaly.  Cardiovascular:     Rate and Rhythm: Normal rate and regular rhythm.     Pulses: Normal pulses.          Radial pulses are 2+ on the right side and 2+ on the left side.     Heart sounds: Normal heart sounds. No murmur heard. Pulmonary:     Effort: Pulmonary effort is normal. No respiratory distress.     Breath sounds:  Normal breath sounds. No wheezing, rhonchi or rales.  Abdominal:     General: Bowel sounds are normal. There is no distension.     Palpations: Abdomen is soft. There is no mass.     Tenderness: There is no abdominal tenderness. There is no guarding or rebound.     Hernia: No hernia is present.  Musculoskeletal:        General: Normal range of motion.     Cervical back: Normal range of motion and neck supple.     Right lower leg: No edema.  Left lower leg: No edema.  Lymphadenopathy:     Cervical: No cervical adenopathy.  Skin:    General: Skin is warm and dry.     Findings: No rash.  Neurological:     General: No focal deficit present.     Mental Status: He is alert and oriented to person, place, and time.  Psychiatric:        Mood and Affect: Mood normal.        Behavior: Behavior normal.        Thought Content: Thought content normal.        Judgment: Judgment normal.       Lab Results  Component Value Date   WBC 5.0 05/25/2022   HGB 13.2 05/25/2022   HCT 40.4 05/25/2022   MCV 81.5 05/25/2022   PLT 137.0 (L) 05/25/2022   Lab Results  Component Value Date   HGBA1C 7.3 (A) 01/08/2023    Lab Results  Component Value Date   CHOL 123 05/25/2022   HDL 31.50 (L) 05/25/2022   LDLCALC 52 05/25/2022   LDLDIRECT 56.0 03/20/2021   TRIG 197.0 (H) 05/25/2022   CHOLHDL 4 05/25/2022   EKG  - NSR rate 80s, normal axis, intervals, no hypertrophy or acute ST/T changes  Assessment & Plan:   Problem List Items Addressed This Visit     Encounter for routine adult health examination with abnormal findings - Primary (Chronic)    Preventative protocols reviewed and updated unless pt declined. Discussed healthy diet and lifestyle.       Hyperlipidemia associated with type 2 diabetes mellitus (HCC)    Chronic, update FLP on simvastatin 20mg  daily. The ASCVD Risk score (Arnett DK, et al., 2019) failed to calculate for the following reasons:   The valid total cholesterol range  is 130 to 320 mg/dL       Relevant Medications   carvedilol (COREG) 25 MG tablet   dapagliflozin propanediol (FARXIGA) 10 MG TABS tablet   simvastatin (ZOCOR) 20 MG tablet   valsartan (DIOVAN) 320 MG tablet   spironolactone (ALDACTONE) 25 MG tablet   Semaglutide, 2 MG/DOSE, 8 MG/3ML SOPN   Other Relevant Orders   Comprehensive metabolic panel   Lipid panel   Essential hypertension    Chronic, markedly elevated initially, significant improvement on recheck at end of visit.  Update EKG.  Update echocardiogram.  Continue 4 drug regimen.       Relevant Medications   carvedilol (COREG) 25 MG tablet   simvastatin (ZOCOR) 20 MG tablet   valsartan (DIOVAN) 320 MG tablet   spironolactone (ALDACTONE) 25 MG tablet   Other Relevant Orders   EKG 12-Lead (Completed)   Hypertensive heart disease without heart failure    H/o this - LVH, grade 2 diastolic dysfunction on echo 2011. Update EKG, discussed possibly updated echocardiogram.       Relevant Medications   carvedilol (COREG) 25 MG tablet   simvastatin (ZOCOR) 20 MG tablet   valsartan (DIOVAN) 320 MG tablet   spironolactone (ALDACTONE) 25 MG tablet   Other Relevant Orders   ECHOCARDIOGRAM COMPLETE   CKD stage 4 due to type 2 diabetes mellitus (HCC)    Appreciate nephrology care seeing Q75mo, goal BP <130/80.  Thought diabetic and hypertensive nephropathy.       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  Tolerating meds well.       Relevant  Medications   dapagliflozin propanediol (FARXIGA) 10 MG TABS tablet   simvastatin (ZOCOR) 20 MG tablet   valsartan (DIOVAN) 320 MG tablet   Semaglutide, 2 MG/DOSE, 8 MG/3ML SOPN   Other Relevant Orders   Hemoglobin A1c   Microalbumin / creatinine urine ratio   Overweight with body mass index (BMI) 25.0-29.9    Ongoing weight loss noted - continue ozempic and healthy diet/lifestyle choices.       Fatty liver    Update LFTs      Secondary hyperparathyroidism of renal origin (HCC)    Update PTH.       Relevant Orders   Parathyroid hormone, intact (no Ca)   Thrombocytopenia (HCC)    Update CBC      Other Visit Diagnoses     Special screening for malignant neoplasms, colon       Relevant Orders   Fecal occult blood, imunochemical        Meds ordered this encounter  Medications   carvedilol (COREG) 25 MG tablet    Sig: Take 1 tablet (25 mg total) by mouth 2 (two) times daily with a meal.    Dispense:  180 tablet    Refill:  4   dapagliflozin propanediol (FARXIGA) 10 MG TABS tablet    Sig: TAKE 1 TABLET DAILY BEFORE BREAKFAST. NOTE NEW DOSE.    Dispense:  90 tablet    Refill:  4   simvastatin (ZOCOR) 20 MG tablet    Sig: Take 1 tablet (20 mg total) by mouth at bedtime.    Dispense:  90 tablet    Refill:  4   valsartan (DIOVAN) 320 MG tablet    Sig: Take 1 tablet (320 mg total) by mouth daily.    Dispense:  90 tablet    Refill:  4   Semaglutide, 2 MG/DOSE, 8 MG/3ML SOPN    Sig: Inject 2 mg as directed once a week.    Dispense:  9 mL    Refill:  3   fluticasone (FLONASE) 50 MCG/ACT nasal spray    Sig: Place 1 spray into both nostrils daily.    Dispense:  16 g    Refill:  3    Orders Placed This Encounter  Procedures   Fecal occult blood, imunochemical    Standing Status:   Future    Standing Expiration Date:   06/04/2024   CBC with Differential/Platelet   Comprehensive metabolic panel   Hemoglobin A1c   Lipid panel   Microalbumin / creatinine urine ratio    Parathyroid hormone, intact (no Ca)   Phosphorus   VITAMIN D 25 Hydroxy (Vit-D Deficiency, Fractures)   EKG 12-Lead   ECHOCARDIOGRAM COMPLETE    Standing Status:   Future    Standing Expiration Date:   06/04/2024    Order Specific Question:   Where should this test be performed    Answer:   [facility]    Order Specific Question:   Perflutren DEFINITY (image enhancing agent) should be administered unless hypersensitivity or allergy exist    Answer:   Administer Perflutren  Order Specific Question:   Is a special reader required? (athlete or structural heart)    Answer:   No    Order Specific Question:   Does this study need to be read by the Structural team/Level 3 readers?    Answer:   No    Order Specific Question:   Reason for exam-Echo    Answer:   Abnormal ECG  R94.31    Patient Instructions  EKG today  Pass by lab to pick up stool kit Labwork today  Sleep questionnaire today.  We will request latest eye exam from Dr Seymour Bars at [facility] in [place] [phone], fax [phone] Good to see you today  Return in 6 months for diabetes and hypertension follow up visit   Follow up plan: Return in about 6 months (around 12/06/2023) for follow up visit.  Eustaquio Boyden, MD

## 2023-06-05 NOTE — Assessment & Plan Note (Addendum)
Chronic, markedly elevated initially, significant improvement on recheck at end of visit.  Update EKG.  Update echocardiogram.  Continue 4 drug regimen.

## 2023-06-05 NOTE — Assessment & Plan Note (Signed)
H/o this - LVH, grade 2 diastolic dysfunction on echo 2011. Update EKG, discussed possibly updated echocardiogram.

## 2023-06-05 NOTE — Assessment & Plan Note (Signed)
Chronic, update FLP on simvastatin 20mg  daily. The ASCVD Risk score (Arnett DK, et al., 2019) failed to calculate for the following reasons:   The valid total cholesterol range is 130 to 320 mg/dL

## 2023-06-06 LAB — CBC WITH DIFFERENTIAL/PLATELET
Absolute Monocytes: 407 cells/uL (ref 200–950)
Basophils Absolute: 20 cells/uL (ref 0–200)
Eosinophils Absolute: 270 cells/uL (ref 15–500)
Eosinophils Relative: 5.5 %
HCT: 36.7 % — ABNORMAL LOW (ref 38.5–50.0)
MCHC: 32.2 g/dL (ref 32.0–36.0)
MPV: 11.4 fL (ref 7.5–12.5)
Monocytes Relative: 8.3 %
Neutro Abs: 2832 cells/uL (ref 1500–7800)
Platelets: 180 10*3/uL (ref 140–400)
RBC: 4.5 10*6/uL (ref 4.20–5.80)
RDW: 13.7 % (ref 11.0–15.0)
WBC: 4.9 10*3/uL (ref 3.8–10.8)

## 2023-06-06 LAB — COMPREHENSIVE METABOLIC PANEL
AG Ratio: 1.4 (calc) (ref 1.0–2.5)
ALT: 19 U/L (ref 9–46)
AST: 24 U/L (ref 10–40)
Albumin: 4.1 g/dL (ref 3.6–5.1)
Alkaline phosphatase (APISO): 61 U/L (ref 36–130)
BUN/Creatinine Ratio: 16 (calc) (ref 6–22)
BUN: 41 mg/dL — ABNORMAL HIGH (ref 7–25)
CO2: 20 mmol/L (ref 20–32)
Calcium: 9.8 mg/dL (ref 8.6–10.3)
Chloride: 111 mmol/L — ABNORMAL HIGH (ref 98–110)
Creat: 2.53 mg/dL — ABNORMAL HIGH (ref 0.60–1.29)
Globulin: 2.9 g/dL (calc) (ref 1.9–3.7)
Glucose, Bld: 102 mg/dL — ABNORMAL HIGH (ref 65–99)
Potassium: 4.6 mmol/L (ref 3.5–5.3)
Sodium: 139 mmol/L (ref 135–146)
Total Bilirubin: 0.8 mg/dL (ref 0.2–1.2)
Total Protein: 7 g/dL (ref 6.1–8.1)

## 2023-06-06 LAB — HEMOGLOBIN A1C
Hgb A1c MFr Bld: 7.5 % of total Hgb — ABNORMAL HIGH (ref <5.7)
Mean Plasma Glucose: 169 mg/dL
eAG (mmol/L): 9.3 mmol/L

## 2023-06-06 LAB — LIPID PANEL
Cholesterol: 141 mg/dL (ref <200)
HDL: 30 mg/dL — ABNORMAL LOW (ref >=40)
LDL Cholesterol (Calc): 80 mg/dL (calc)
Non-HDL Cholesterol (Calc): 111 mg/dL (calc) (ref <130)
Total CHOL/HDL Ratio: 4.7 (calc) (ref <5.0)
Triglycerides: 212 mg/dL — ABNORMAL HIGH (ref <150)

## 2023-06-06 LAB — MICROALBUMIN / CREATININE URINE RATIO
Creatinine, Urine: 89 mg/dL (ref 20–320)
Microalb Creat Ratio: 66 mg/g creat — ABNORMAL HIGH (ref <30)
Microalb, Ur: 5.9 mg/dL

## 2023-06-06 LAB — VITAMIN D 25 HYDROXY (VIT D DEFICIENCY, FRACTURES): Vit D, 25-Hydroxy: 62 ng/mL (ref 30–100)

## 2023-06-06 LAB — PARATHYROID HORMONE, INTACT (NO CA): PTH: 88 pg/mL — ABNORMAL HIGH (ref 16–77)

## 2023-06-06 LAB — PHOSPHORUS: Phosphorus: 3.8 mg/dL (ref 2.5–4.5)

## 2023-06-10 ENCOUNTER — Other Ambulatory Visit (INDEPENDENT_AMBULATORY_CARE_PROVIDER_SITE_OTHER): Payer: BC Managed Care – PPO

## 2023-06-10 DIAGNOSIS — Z1211 Encounter for screening for malignant neoplasm of colon: Secondary | ICD-10-CM | POA: Diagnosis not present

## 2023-06-10 NOTE — Progress Notes (Signed)
Pt dropping off IFOB.

## 2023-06-11 LAB — FECAL OCCULT BLOOD, IMMUNOCHEMICAL: Fecal Occult Bld: NEGATIVE

## 2023-07-04 ENCOUNTER — Ambulatory Visit (HOSPITAL_BASED_OUTPATIENT_CLINIC_OR_DEPARTMENT_OTHER)
Admission: RE | Admit: 2023-07-04 | Discharge: 2023-07-04 | Disposition: A | Discharge status: Home / Self Care | Payer: BC Managed Care – PPO | Source: Ambulatory Visit | Attending: Family Medicine | Admitting: Family Medicine

## 2023-07-04 DIAGNOSIS — I119 Hypertensive heart disease without heart failure: Secondary | ICD-10-CM | POA: Insufficient documentation

## 2023-07-04 LAB — ECHOCARDIOGRAM COMPLETE
Area-P 1/2: 4.1 cm2
P 1/2 time: 497 msec
S' Lateral: 2.9 cm

## 2023-07-17 ENCOUNTER — Other Ambulatory Visit: Payer: Self-pay

## 2023-07-17 DIAGNOSIS — I10 Essential (primary) hypertension: Secondary | ICD-10-CM

## 2023-07-17 MED ORDER — SPIRONOLACTONE 25 MG PO TABS
25.0000 mg | ORAL_TABLET | Freq: Every day | ORAL | 4 refills | Status: DC
Start: 2023-07-17 — End: 2023-12-06

## 2023-07-25 LAB — LAB REPORT - SCANNED
Albumin, Urine POC: 63.1
Creatinine, POC: 100.2 mg/dL
EGFR: 28
Microalb Creat Ratio: 63

## 2023-08-08 ENCOUNTER — Other Ambulatory Visit (HOSPITAL_COMMUNITY): Payer: Self-pay

## 2023-08-08 ENCOUNTER — Telehealth: Payer: Self-pay

## 2023-08-08 NOTE — Telephone Encounter (Signed)
Pharmacy Patient Advocate Encounter   Received notification from CoverMyMeds that prior authorization for [facility] is required/requested.   Insurance verification completed.   The patient is insured through [employer] [facility] .   Per test claim: PA required; PA submitted to [employer] [facility] via CoverMyMeds Key/confirmation #/EOC [id number]   Status is pending

## 2023-08-09 ENCOUNTER — Other Ambulatory Visit (HOSPITAL_COMMUNITY): Payer: Self-pay

## 2023-08-09 NOTE — Telephone Encounter (Signed)
Attempted to call pt.  Left a message to call office if he has questions but also sent mychart message.

## 2023-08-09 NOTE — Telephone Encounter (Signed)
Pharmacy Patient Advocate Encounter  Received notification from [employer] [facility] that Prior Authorization for [facility]  has been APPROVED from 08/08/23 to 08/07/26   PA #/Case ID/Reference #: [id number]

## 2023-12-06 ENCOUNTER — Encounter: Payer: Self-pay | Admitting: Family Medicine

## 2023-12-06 ENCOUNTER — Ambulatory Visit (INDEPENDENT_AMBULATORY_CARE_PROVIDER_SITE_OTHER): Payer: 59 | Admitting: Family Medicine

## 2023-12-06 VITALS — BP 138/88 | HR 95 | Temp 98.6°F | Ht 74.0 in | Wt 225.0 lb

## 2023-12-06 DIAGNOSIS — I10 Essential (primary) hypertension: Secondary | ICD-10-CM

## 2023-12-06 DIAGNOSIS — Z23 Encounter for immunization: Secondary | ICD-10-CM | POA: Diagnosis not present

## 2023-12-06 DIAGNOSIS — N184 Chronic kidney disease, stage 4 (severe): Secondary | ICD-10-CM

## 2023-12-06 DIAGNOSIS — E1121 Type 2 diabetes mellitus with diabetic nephropathy: Secondary | ICD-10-CM

## 2023-12-06 DIAGNOSIS — I119 Hypertensive heart disease without heart failure: Secondary | ICD-10-CM | POA: Diagnosis not present

## 2023-12-06 DIAGNOSIS — E1122 Type 2 diabetes mellitus with diabetic chronic kidney disease: Secondary | ICD-10-CM

## 2023-12-06 LAB — POCT GLYCOSYLATED HEMOGLOBIN (HGB A1C): Hemoglobin A1C: 8.4 % — AB (ref 4.0–5.6)

## 2023-12-06 NOTE — Progress Notes (Signed)
 Ph: [phone] Fax: [phone]   Patient ID: Rodney Ortiz, male    DOB: 05/19/1976, 48 y.o.   MRN: [id number]  This visit was conducted in person.  BP 138/88 (BP Location: Left Arm, Patient Position: Sitting, Cuff Size: Large)   Pulse 95   Temp 98.6 F (37 C) (Oral)   Ht 6' 2 (1.88 m)   Wt 225 lb (102.1 kg)   SpO2 99%   BMI 28.89 kg/m    CC: 6 mo HTN/DM f/u visit  Subjective:   HPI: Rodney Ortiz is a 48 y.o. male presenting on 12/06/2023 for 6 Month Follow-up   CKD stage 4 - established with Dr Gearline nephrologist. Goal BP <130/80. Most recently saw Alm Shown PA 07/2023, stopped spironolactone . Next appt 02/2024.   HTN - continues carvedilol  25mg  bid, valsartan  320mg  daily.  Echocardiogram 07/2023: LVEF 55-60%, normal wall motion, mod LVH, G1DD, mild AR.   DM - does not regularly check sugars. Compliant with antihyperglycemic regimen which includes: ozempic  2mg  weekly, farxiga  10mg  daily. Denies low sugars or hypoglycemic symptoms. Denies paresthesias, blurry vision. Last diabetic eye exam 2024 - planning to reschedule with [facility]. Glucometer brand: one-touch ultra mini. Last foot exam: 01/2023. DSME: 2018 at [facility]. Lab Results  Component Value Date   HGBA1C 8.4 (A) 12/06/2023   Diabetic Foot Exam - Simple   Simple Foot Form Diabetic Foot exam was performed with the following findings: Yes 12/06/2023  3:56 PM  Visual Inspection No deformities, no ulcerations, no other skin breakdown bilaterally: Yes Sensation Testing Intact to touch and monofilament testing bilaterally: Yes Pulse Check Posterior Tibialis and Dorsalis pulse intact bilaterally: Yes Comments No claudication    Lab Results  Component Value Date   MICROALBUR 5.9 06/05/2023        Relevant past medical, surgical, family and social history reviewed and updated as indicated. Interim medical history since our last visit reviewed. Allergies and medications reviewed and updated. Outpatient  Medications Prior to Visit  Medication Sig Dispense Refill   Blood Glucose Monitoring Suppl (ONE TOUCH ULTRA MINI) w/Device KIT 1 kit by Does not apply route as directed. 1 each 0   carvedilol  (COREG ) 25 MG tablet Take 1 tablet (25 mg total) by mouth 2 (two) times daily with a meal. 180 tablet 4   Cholecalciferol (VITAMIN D ) 50 MCG (2000 UT) CAPS Take 1 capsule (2,000 Units total) by mouth daily. 30 capsule    dapagliflozin  propanediol (FARXIGA ) 10 MG TABS tablet TAKE 1 TABLET DAILY BEFORE BREAKFAST. NOTE NEW DOSE. 90 tablet 4   fluticasone  (FLONASE ) 50 MCG/ACT nasal spray Place 1 spray into both nostrils daily. 16 g 3   glucose blood (ONE TOUCH ULTRA TEST) test strip Use as instructed to check sugars daily and as needed 100 each 3   ONETOUCH DELICA LANCETS 33G MISC Check blood sugar daily and as directed or needed. 100 each 2   Semaglutide , 2 MG/DOSE, 8 MG/3ML SOPN Inject 2 mg as directed once a week. 9 mL 3   simvastatin  (ZOCOR ) 20 MG tablet Take 1 tablet (20 mg total) by mouth at bedtime. 90 tablet 4   valsartan  (DIOVAN ) 320 MG tablet Take 1 tablet (320 mg total) by mouth daily. 90 tablet 4   spironolactone  (ALDACTONE ) 25 MG tablet Take 1 tablet (25 mg total) by mouth daily. 90 tablet 4   No facility-administered medications prior to visit.     Per HPI unless specifically indicated in ROS section below Review of Systems  Objective:  BP 138/88 (BP Location: Left Arm, Patient Position: Sitting, Cuff Size: Large)   Pulse 95   Temp 98.6 F (37 C) (Oral)   Ht 6' 2 (1.88 m)   Wt 225 lb (102.1 kg)   SpO2 99%   BMI 28.89 kg/m   Wt Readings from Last 3 Encounters:  12/06/23 225 lb (102.1 kg)  06/05/23 223 lb 2 oz (101.2 kg)  01/08/23 227 lb 6 oz (103.1 kg)      Physical Exam Vitals and nursing note reviewed.  Constitutional:      Appearance: Normal appearance. He is not ill-appearing.  Eyes:     Extraocular Movements: Extraocular movements intact.     Conjunctiva/sclera:  Conjunctivae normal.     Pupils: Pupils are equal, round, and reactive to light.  Cardiovascular:     Rate and Rhythm: Normal rate and regular rhythm.     Pulses: Normal pulses.     Heart sounds: Normal heart sounds. No murmur heard. Pulmonary:     Effort: Pulmonary effort is normal. No respiratory distress.     Breath sounds: Normal breath sounds. No wheezing, rhonchi or rales.  Musculoskeletal:     Right lower leg: No edema.     Left lower leg: No edema.     Comments: See HPI for foot exam if done  Skin:    General: Skin is warm and dry.     Findings: No rash.  Neurological:     Mental Status: He is alert.  Psychiatric:        Mood and Affect: Mood normal.        Behavior: Behavior normal.       Results for orders placed or performed in visit on 12/06/23  HgB A1c   Collection Time: 12/06/23  3:35 PM  Result Value Ref Range   Hemoglobin A1C 8.4 (A) 4.0 - 5.6 %   HbA1c POC (<> result, manual entry)     HbA1c, POC (prediabetic range)     HbA1c, POC (controlled diabetic range)      Assessment & Plan:   Problem List Items Addressed This Visit     Essential hypertension   Chronic, above goal in setting of recently stopping spironolactone .  Update renal panel, for consideration to restart spironolactone - overdue for this, will forward results to nephrology.       Hypertensive heart disease without heart failure   Latest echo overall reassuring, reviewed with patient:  Echocardiogram 07/2023: LVEF 55-60%, normal wall motion, mod LVH, G1DD, mild AR.       CKD stage 4 due to type 2 diabetes mellitus [facility])   Appreciate nephrology care, seeing Q6 months.  Goal BP <130/80.       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  He declines additional medication at this time.  Will renew efforts to follow low sugar low carb diabetic diet Reassess with lab work in 3 months to [facility] (closer to home).  RTC 6 mo CPE      Relevant Orders   HgB A1c (Completed)   Renal function panel   Hemoglobin A1c   Other Visit Diagnoses       Need for influenza vaccination       Relevant Orders   Flu vaccine trivalent PF, 6mos and older(Flulaval,Afluria,Fluarix,Fluzone) (Completed)        No orders of the defined types were placed in this encounter.  Orders Placed This Encounter  Procedures   Flu vaccine trivalent PF, 6mos and older(Flulaval,Afluria,Fluarix,Fluzone)   Renal function panel   Hemoglobin A1c    Standing Status:   Future    Expiration Date:   12/05/2024   HgB A1c    Patient Instructions  Labs today - we will forward results to Dr Tod office.  We will request diabetic eye exam from eye doctor. Schedule next diabetic eye exam, ask to have report faxed to our office [phone].  Good to see you today.  Return as needed or in 6 months for physical Return in 3 months for labs only (POC A1c) - see if you can get this done at [facility]   Follow up plan: Return in about 6 months (around 06/04/2024), or if symptoms worsen or fail to improve, for annual exam, prior fasting for blood work.  Anton Blas, MD

## 2023-12-06 NOTE — Assessment & Plan Note (Addendum)
 Chronic, deteriorated control in setting of holiday diet.  He declines additional medication at this time.  Will renew efforts to follow low sugar low carb diabetic diet Reassess with lab work in 3 months to [facility] (closer to home).  RTC 6 mo CPE

## 2023-12-06 NOTE — Assessment & Plan Note (Signed)
 Chronic, above goal in setting of recently stopping spironolactone.  Update renal panel, for consideration to restart spironolactone- overdue for this, will forward results to nephrology.

## 2023-12-06 NOTE — Assessment & Plan Note (Addendum)
 Latest echo overall reassuring, reviewed with patient:  Echocardiogram 07/2023: LVEF 55-60%, normal wall motion, mod LVH, G1DD, mild AR.

## 2023-12-06 NOTE — Patient Instructions (Addendum)
 Labs today - we will forward results to Dr Tod office.  We will request diabetic eye exam from eye doctor. Schedule next diabetic eye exam, ask to have report faxed to our office [phone].  Good to see you today.  Return as needed or in 6 months for physical Return in 3 months for labs only (POC A1c) - see if you can get this done at [facility]

## 2023-12-06 NOTE — Assessment & Plan Note (Signed)
 Appreciate nephrology care, seeing Q6 months.  Goal BP <130/80.

## 2023-12-07 ENCOUNTER — Other Ambulatory Visit: Payer: Self-pay | Admitting: Family Medicine

## 2023-12-07 ENCOUNTER — Encounter: Payer: Self-pay | Admitting: Family Medicine

## 2023-12-07 LAB — RENAL FUNCTION PANEL
Albumin: 4.3 g/dL (ref 3.6–5.1)
BUN/Creatinine Ratio: 14 (calc) (ref 6–22)
BUN: 31 mg/dL — ABNORMAL HIGH (ref 7–25)
CO2: 22 mmol/L (ref 20–32)
Calcium: 9.9 mg/dL (ref 8.6–10.3)
Chloride: 109 mmol/L (ref 98–110)
Creat: 2.25 mg/dL — ABNORMAL HIGH (ref 0.60–1.29)
Glucose, Bld: 159 mg/dL — ABNORMAL HIGH (ref 65–99)
Phosphorus: 3.7 mg/dL (ref 2.5–4.5)
Potassium: 4.2 mmol/L (ref 3.5–5.3)
Sodium: 141 mmol/L (ref 135–146)

## 2024-06-07 ENCOUNTER — Other Ambulatory Visit: Payer: Self-pay | Admitting: Family Medicine

## 2024-06-08 ENCOUNTER — Encounter: Payer: Self-pay | Admitting: Family Medicine

## 2024-06-08 ENCOUNTER — Ambulatory Visit: Payer: 59 | Admitting: Family Medicine

## 2024-06-08 VITALS — BP 138/102 | HR 94 | Temp 98.6°F | Ht 74.5 in | Wt 220.0 lb

## 2024-06-08 DIAGNOSIS — Z125 Encounter for screening for malignant neoplasm of prostate: Secondary | ICD-10-CM | POA: Diagnosis not present

## 2024-06-08 DIAGNOSIS — Z1211 Encounter for screening for malignant neoplasm of colon: Secondary | ICD-10-CM

## 2024-06-08 DIAGNOSIS — Z7984 Long term (current) use of oral hypoglycemic drugs: Secondary | ICD-10-CM

## 2024-06-08 DIAGNOSIS — Z0001 Encounter for general adult medical examination with abnormal findings: Secondary | ICD-10-CM

## 2024-06-08 DIAGNOSIS — I10 Essential (primary) hypertension: Secondary | ICD-10-CM

## 2024-06-08 DIAGNOSIS — K760 Fatty (change of) liver, not elsewhere classified: Secondary | ICD-10-CM

## 2024-06-08 DIAGNOSIS — E1169 Type 2 diabetes mellitus with other specified complication: Secondary | ICD-10-CM

## 2024-06-08 DIAGNOSIS — N2581 Secondary hyperparathyroidism of renal origin: Secondary | ICD-10-CM

## 2024-06-08 DIAGNOSIS — E1121 Type 2 diabetes mellitus with diabetic nephropathy: Secondary | ICD-10-CM | POA: Diagnosis not present

## 2024-06-08 DIAGNOSIS — N184 Chronic kidney disease, stage 4 (severe): Secondary | ICD-10-CM | POA: Diagnosis not present

## 2024-06-08 DIAGNOSIS — E1122 Type 2 diabetes mellitus with diabetic chronic kidney disease: Secondary | ICD-10-CM

## 2024-06-08 DIAGNOSIS — E785 Hyperlipidemia, unspecified: Secondary | ICD-10-CM

## 2024-06-08 DIAGNOSIS — Z0000 Encounter for general adult medical examination without abnormal findings: Secondary | ICD-10-CM

## 2024-06-08 DIAGNOSIS — Z7985 Long-term (current) use of injectable non-insulin antidiabetic drugs: Secondary | ICD-10-CM

## 2024-06-08 MED ORDER — FLUTICASONE PROPIONATE 50 MCG/ACT NA SUSP: 1.0000 | Freq: Every day | NASAL | 11 refills | Status: AC

## 2024-06-08 MED ORDER — DAPAGLIFLOZIN PROPANEDIOL 10 MG PO TABS: 10.0000 mg | ORAL_TABLET | Freq: Every day | ORAL | 11 refills | Status: AC

## 2024-06-08 MED ORDER — VALSARTAN 320 MG PO TABS: 320.0000 mg | ORAL_TABLET | Freq: Every day | ORAL | 3 refills | Status: AC

## 2024-06-08 MED ORDER — SPIRONOLACTONE 25 MG PO TABS
12.5000 mg | ORAL_TABLET | Freq: Every day | ORAL | 3 refills | Status: AC
Start: 2024-06-08 — End: ?

## 2024-06-08 MED ORDER — FREESTYLE LIBRE 3 PLUS SENSOR MISC: 11 refills | Status: DC

## 2024-06-08 MED ORDER — SEMAGLUTIDE (2 MG/DOSE) 8 MG/3ML ~~LOC~~ SOPN: 2.0000 mg | PEN_INJECTOR | SUBCUTANEOUS | 3 refills | Status: DC

## 2024-06-08 MED ORDER — SIMVASTATIN 20 MG PO TABS: 20.0000 mg | ORAL_TABLET | Freq: Every day | ORAL | 3 refills | Status: AC

## 2024-06-08 MED ORDER — CARVEDILOL 25 MG PO TABS: 25.0000 mg | ORAL_TABLET | Freq: Two times a day (BID) | ORAL | 3 refills | Status: AC

## 2024-06-08 NOTE — Assessment & Plan Note (Addendum)
 Noted on US  11/2019. Update LFTs

## 2024-06-08 NOTE — Assessment & Plan Note (Signed)
 Chronic, appreciate nephrology care. Next appt 08/2024.

## 2024-06-08 NOTE — Assessment & Plan Note (Signed)
 Chronic, above goal in CKD <130/80.  Continue current regimen, consider med titration pending lab results today.

## 2024-06-08 NOTE — Progress Notes (Signed)
 Ph: [phone] Fax: [phone]   Patient ID: Rodney Ortiz, male    DOB: 02-Jan-1976, 48 y.o.   MRN: [id number]  This visit was conducted in person.  BP (!) 138/102 (BP Location: Right Arm, Cuff Size: Large)   Pulse 94   Temp 98.6 F (37 C) (Oral)   Ht 6' 2.5 (1.892 m)   Wt 220 lb (99.8 kg)   SpO2 99%   BMI 27.87 kg/m   BP Readings from Last 3 Encounters:  06/08/24 (!) 138/102  12/06/23 138/88  06/05/23 128/88    CC: CPE Subjective:   HPI: Rodney Ortiz is a 49 y.o. male presenting on 06/08/2024 for Annual Exam   Lives in [place].   CKD stage 4 - established with Dr Gearline Leash kidney nephrologist, last seen 02/2024. Goal BP <130/80.   HTN - continues carvedilol  25mg  bid, valsartan  320mg  daily, spironolactone  12.5mg  daily.  Echocardiogram 07/2023: LVEF 55-60%, normal wall motion, mod LVH, G1DD, mild AR.   Known diabetic on farxiga  10mg  daily, ozempic  2mg  weekly Lab Results  Component Value Date   HGBA1C 8.4 (A) 12/06/2023   Preventative: Colon cancer screening - yearly iFOB, agrees to cologuard  Prostate cancer screening - no known fmhx prostate cancer. PSA yearly. Nocturia x1-2.  Lung cancer screening - not eligible  Flu shot yearly  COVID vaccine 01/2020, 02/2020  Tdap 2015  Pneumovax-23 2015  Seat belt use discussed  Sunscreen use discussed. No changing moles on skin.  Sleep - averaging 7-8 hours/night Non smoker  Alcohol - none  Dentist - due  Eye exam - yearly at [facility] - upcoming end of July    Lives with wife Ara) Occ: Tour manager in [employer]  Activity: limited - walking, enjoys hiking in mountains  Diet: good water, fruits/vegetables daily      Relevant past medical, surgical, family and social history reviewed and updated as indicated. Interim medical history since our last visit reviewed. Allergies and medications reviewed and updated. Outpatient Medications Prior to Visit  Medication Sig Dispense Refill   Blood Glucose  Monitoring Suppl (ONE TOUCH ULTRA MINI) w/Device KIT 1 kit by Does not apply route as directed. 1 each 0   Cholecalciferol (VITAMIN D ) 50 MCG (2000 UT) CAPS Take 1 capsule (2,000 Units total) by mouth daily. 30 capsule    glucose blood (ONE TOUCH ULTRA TEST) test strip Use as instructed to check sugars daily and as needed 100 each 3   ONETOUCH DELICA LANCETS 33G MISC Check blood sugar daily and as directed or needed. 100 each 2   carvedilol  (COREG ) 25 MG tablet Take 1 tablet (25 mg total) by mouth 2 (two) times daily with a meal. 180 tablet 4   dapagliflozin  propanediol (FARXIGA ) 10 MG TABS tablet TAKE 1 TABLET DAILY BEFORE BREAKFAST. NOTE NEW DOSE. 90 tablet 4   fluticasone  (FLONASE ) 50 MCG/ACT nasal spray Place 1 spray into both nostrils daily. 16 g 3   Semaglutide , 2 MG/DOSE, 8 MG/3ML SOPN Inject 2 mg as directed once a week. 9 mL 3   simvastatin  (ZOCOR ) 20 MG tablet Take 1 tablet (20 mg total) by mouth at bedtime. 90 tablet 4   spironolactone  (ALDACTONE ) 25 MG tablet Take 0.5 tablets (12.5 mg total) by mouth daily.     valsartan  (DIOVAN ) 320 MG tablet Take 1 tablet (320 mg total) by mouth daily. 90 tablet 4   No facility-administered medications prior to visit.     Per HPI unless specifically indicated in  ROS section below Review of Systems  Constitutional:  Negative for activity change, appetite change, chills, fatigue, fever and unexpected weight change.  HENT:  Negative for hearing loss.   Eyes:  Negative for visual disturbance.  Respiratory:  Negative for cough, chest tightness, shortness of breath and wheezing.   Cardiovascular:  Negative for chest pain, palpitations and leg swelling.  Gastrointestinal:  Negative for abdominal distention, abdominal pain, blood in stool, constipation, diarrhea, nausea and vomiting.  Genitourinary:  Negative for difficulty urinating and hematuria.  Musculoskeletal:  Negative for arthralgias, myalgias and neck pain.  Skin:  Negative for rash.   Neurological:  Negative for dizziness, seizures, syncope and headaches.  Hematological:  Negative for adenopathy. Does not bruise/bleed easily.  Psychiatric/Behavioral:  Negative for dysphoric mood. The patient is not nervous/anxious.     Objective:  BP (!) 138/102 (BP Location: Right Arm, Cuff Size: Large)   Pulse 94   Temp 98.6 F (37 C) (Oral)   Ht 6' 2.5 (1.892 m)   Wt 220 lb (99.8 kg)   SpO2 99%   BMI 27.87 kg/m   Wt Readings from Last 3 Encounters:  06/08/24 220 lb (99.8 kg)  12/06/23 225 lb (102.1 kg)  06/05/23 223 lb 2 oz (101.2 kg)      Physical Exam Vitals and nursing note reviewed.  Constitutional:      General: He is not in acute distress.    Appearance: Normal appearance. He is well-developed. He is not ill-appearing.  HENT:     Head: Normocephalic and atraumatic.     Right Ear: Hearing, tympanic membrane, ear canal and external ear normal.     Left Ear: Hearing, tympanic membrane, ear canal and external ear normal.     Mouth/Throat:     Mouth: Mucous membranes are moist.     Pharynx: Oropharynx is clear. No oropharyngeal exudate or posterior oropharyngeal erythema.  Eyes:     General: No scleral icterus.    Extraocular Movements: Extraocular movements intact.     Conjunctiva/sclera: Conjunctivae normal.     Pupils: Pupils are equal, round, and reactive to light.  Neck:     Thyroid: No thyroid mass or thyromegaly.  Cardiovascular:     Rate and Rhythm: Normal rate and regular rhythm.     Pulses: Normal pulses.          Radial pulses are 2+ on the right side and 2+ on the left side.     Heart sounds: Normal heart sounds. No murmur heard. Pulmonary:     Effort: Pulmonary effort is normal. No respiratory distress.     Breath sounds: Normal breath sounds. No wheezing, rhonchi or rales.  Abdominal:     General: Bowel sounds are normal. There is no distension.     Palpations: Abdomen is soft. There is no mass.     Tenderness: There is no abdominal  tenderness. There is no guarding or rebound.     Hernia: No hernia is present.  Musculoskeletal:        General: Normal range of motion.     Cervical back: Normal range of motion and neck supple.     Right lower leg: No edema.     Left lower leg: No edema.  Lymphadenopathy:     Cervical: No cervical adenopathy.  Skin:    General: Skin is warm and dry.     Findings: No rash.  Neurological:     General: No focal deficit present.     Mental Status:  He is alert and oriented to person, place, and time.  Psychiatric:        Mood and Affect: Mood normal.        Behavior: Behavior normal.        Thought Content: Thought content normal.        Judgment: Judgment normal.       Results for orders placed or performed in visit on 12/09/23  HM DIABETES EYE EXAM   Collection Time: 10/12/22 12:04 PM  Result Value Ref Range   HM Diabetic Eye Exam      Assessment & Plan:   Problem List Items Addressed This Visit     Encounter for routine adult health examination with abnormal findings - Primary (Chronic)   Preventative protocols reviewed and updated unless pt declined. Discussed healthy diet and lifestyle.       Hyperlipidemia associated with type 2 diabetes mellitus (HCC)   Chronic, on simvastatin  20mg  - update FLP. The 10-year ASCVD risk score (Arnett DK, et al., 2019) is: 19.2%   Values used to calculate the score:     Age: 23 years     Clincally relevant sex: Male     Is Non-Hispanic African American: Yes     Diabetic: Yes     Tobacco smoker: No     Systolic Blood Pressure: 138 mmHg     Is BP treated: Yes     HDL Cholesterol: 30 mg/dL     Total Cholesterol: 141 mg/dL       Relevant Medications   carvedilol  (COREG ) 25 MG tablet   dapagliflozin  propanediol (FARXIGA ) 10 MG TABS tablet   Semaglutide , 2 MG/DOSE, 8 MG/3ML SOPN   simvastatin  (ZOCOR ) 20 MG tablet   spironolactone  (ALDACTONE ) 25 MG tablet   valsartan  (DIOVAN ) 320 MG tablet   Other Relevant Orders   Lipid panel    Comprehensive metabolic panel with GFR   Essential hypertension   Chronic, above goal in CKD <130/80.  Continue current regimen, consider med titration pending lab results today.       Relevant Medications   carvedilol  (COREG ) 25 MG tablet   simvastatin  (ZOCOR ) 20 MG tablet   spironolactone  (ALDACTONE ) 25 MG tablet   valsartan  (DIOVAN ) 320 MG tablet   CKD stage 4 due to type 2 diabetes mellitus (HCC)   Chronic, appreciate nephrology care. Next appt 08/2024.       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  Update A1c on farxiga  and ozempic .       Relevant Medications   dapagliflozin  propanediol (FARXIGA ) 10 MG TABS tablet   Semaglutide , 2 MG/DOSE, 8 MG/3ML SOPN   simvastatin  (ZOCOR ) 20 MG tablet   valsartan  (DIOVAN ) 320 MG tablet   Other Relevant Orders   Hemoglobin A1c   Microalbumin / creatinine urine ratio   Vitamin B12   Metabolic dysfunction-associated fatty liver disease (MAFLD)   Noted on US  11/2019. Update LFTs      Secondary hyperparathyroidism of renal  origin [facility])   Update PTH      Other Visit Diagnoses       Special screening for malignant neoplasm of prostate       Relevant Orders   PSA     Special screening for malignant neoplasms, colon       Relevant Orders   Cologuard        Meds ordered this encounter  Medications   carvedilol  (COREG ) 25 MG tablet    Sig: Take 1 tablet (25 mg total) by mouth 2 (two) times daily  with a meal.    Dispense:  180 tablet    Refill:  3   dapagliflozin  propanediol (FARXIGA ) 10 MG TABS tablet    Sig: Take 1 tablet (10 mg total) by mouth daily.    Dispense:  30 tablet    Refill:  11   fluticasone  (FLONASE ) 50 MCG/ACT nasal spray    Sig: Place 1 spray into both nostrils daily.    Dispense:  16 g    Refill:  11   Semaglutide , 2 MG/DOSE, 8 MG/3ML SOPN    Sig: Inject 2 mg as directed once a week.    Dispense:  9 mL    Refill:  3   simvastatin  (ZOCOR ) 20 MG tablet    Sig: Take 1 tablet (20 mg total) by mouth at bedtime.    Dispense:  90 tablet    Refill:  3   spironolactone  (ALDACTONE ) 25 MG tablet    Sig: Take 0.5 tablets (12.5 mg total) by mouth daily.    Dispense:  45 tablet    Refill:  3   valsartan  (DIOVAN ) 320 MG tablet    Sig: Take 1 tablet (320 mg total) by mouth daily.    Dispense:  90 tablet    Refill:  3   Continuous Glucose Sensor (FREESTYLE LIBRE 3 PLUS SENSOR) MISC    Sig: Change sensor every 15 days.    Dispense:  2 each    Refill:  11    Orders Placed This Encounter  Procedures   Hemoglobin A1c   Lipid panel   Comprehensive metabolic panel with GFR   Phosphorus   VITAMIN D  25 Hydroxy (Vit-D Deficiency, Fractures)   Microalbumin / creatinine urine ratio   Parathyroid  hormone, intact (no Ca)   CBC with Differential/Platelet   Vitamin B12   PSA   Cologuard    Patient Instructions  Labs today  BP staying a bit elevated - continue medicines, start monitoring at home and let me know if consistently staying >130/80.  We will sign you up for cologuard  Price out continuous glucose monitor sensors sent to local pharmacy.  Good to see you today  Return or in 6 months for diabetes follow up visit.   Follow up plan: Return in about 6 months (around 12/09/2024) for follow up visit.  Anton Blas, MD

## 2024-06-08 NOTE — Assessment & Plan Note (Signed)
 Chronic, on simvastatin  20mg  - update FLP. The 10-year ASCVD risk score (Arnett DK, et al., 2019) is: 19.2%   Values used to calculate the score:     Age: 48 years     Clincally relevant sex: Male     Is Non-Hispanic African American: Yes     Diabetic: Yes     Tobacco smoker: No     Systolic Blood Pressure: 138 mmHg     Is BP treated: Yes     HDL Cholesterol: 30 mg/dL     Total Cholesterol: 141 mg/dL

## 2024-06-08 NOTE — Assessment & Plan Note (Signed)
 Preventative protocols reviewed and updated unless pt declined. Discussed healthy diet and lifestyle.

## 2024-06-08 NOTE — Patient Instructions (Addendum)
 Labs today  BP staying a bit elevated - continue medicines, start monitoring at home and let me know if consistently staying >130/80.  We will sign you up for cologuard  Price out continuous glucose monitor sensors sent to local pharmacy.  Good to see you today  Return or in 6 months for diabetes follow up visit.

## 2024-06-08 NOTE — Assessment & Plan Note (Signed)
 Update PTH.

## 2024-06-08 NOTE — Assessment & Plan Note (Addendum)
 Chronic, endorses recent dietary liberties in setting of vacation.  Update A1c on farxiga  and ozempic .

## 2024-06-09 LAB — COMPREHENSIVE METABOLIC PANEL WITH GFR
ALT: 18 U/L (ref 0–53)
AST: 18 U/L (ref 0–37)
Albumin: 4.2 g/dL (ref 3.5–5.2)
Alkaline Phosphatase: 66 U/L (ref 39–117)
BUN: 39 mg/dL — ABNORMAL HIGH (ref 6–23)
CO2: 23 meq/L (ref 19–32)
Calcium: 9.8 mg/dL (ref 8.4–10.5)
Chloride: 107 meq/L (ref 96–112)
Creatinine, Ser: 2.65 mg/dL — ABNORMAL HIGH (ref 0.40–1.50)
GFR: 27.32 mL/min — ABNORMAL LOW (ref >60.00)
Glucose, Bld: 190 mg/dL — ABNORMAL HIGH (ref 70–99)
Potassium: 4.7 meq/L (ref 3.5–5.1)
Sodium: 137 meq/L (ref 135–145)
Total Bilirubin: 1.1 mg/dL (ref 0.2–1.2)
Total Protein: 7 g/dL (ref 6.0–8.3)

## 2024-06-09 LAB — MICROALBUMIN / CREATININE URINE RATIO
Creatinine,U: 95.2 mg/dL
Microalb Creat Ratio: 55.4 mg/g — ABNORMAL HIGH (ref 0.0–30.0)
Microalb, Ur: 5.3 mg/dL — ABNORMAL HIGH (ref 0.0–1.9)

## 2024-06-09 LAB — CBC WITH DIFFERENTIAL/PLATELET
Basophils Absolute: 0 K/uL (ref 0.0–0.1)
Basophils Relative: 0.9 % (ref 0.0–3.0)
Eosinophils Absolute: 0.2 K/uL (ref 0.0–0.7)
Eosinophils Relative: 3.8 % (ref 0.0–5.0)
HCT: 39.6 % (ref 39.0–52.0)
Hemoglobin: 12.9 g/dL — ABNORMAL LOW (ref 13.0–17.0)
Lymphocytes Relative: 28.8 % (ref 12.0–46.0)
Lymphs Abs: 1.4 K/uL (ref 0.7–4.0)
MCHC: 32.5 g/dL (ref 30.0–36.0)
MCV: 80 fl (ref 78.0–100.0)
Monocytes Absolute: 0.4 K/uL (ref 0.1–1.0)
Monocytes Relative: 9.2 % (ref 3.0–12.0)
Neutro Abs: 2.8 K/uL (ref 1.4–7.7)
Neutrophils Relative %: 57.3 % (ref 43.0–77.0)
Platelets: 124 K/uL — ABNORMAL LOW (ref 150.0–400.0)
RBC: 4.95 Mil/uL (ref 4.22–5.81)
RDW: 14 % (ref 11.5–15.5)
WBC: 4.9 K/uL (ref 4.0–10.5)

## 2024-06-09 LAB — VITAMIN D 25 HYDROXY (VIT D DEFICIENCY, FRACTURES): VITD: 41.04 ng/mL (ref 30.00–100.00)

## 2024-06-09 LAB — LIPID PANEL
Cholesterol: 120 mg/dL (ref 0–200)
HDL: 27.9 mg/dL — ABNORMAL LOW (ref >39.00)
LDL Cholesterol: 30 mg/dL (ref 0–99)
NonHDL: 92.5
Total CHOL/HDL Ratio: 4
Triglycerides: 315 mg/dL — ABNORMAL HIGH (ref 0.0–149.0)
VLDL: 63 mg/dL — ABNORMAL HIGH (ref 0.0–40.0)

## 2024-06-09 LAB — PSA: PSA: 1.22 ng/mL (ref 0.10–4.00)

## 2024-06-09 LAB — VITAMIN B12: Vitamin B-12: 278 pg/mL (ref 211–911)

## 2024-06-09 LAB — HEMOGLOBIN A1C: Hgb A1c MFr Bld: 9.5 % — ABNORMAL HIGH (ref 4.6–6.5)

## 2024-06-09 LAB — PARATHYROID HORMONE, INTACT (NO CA): PTH: 50 pg/mL (ref 16–77)

## 2024-06-09 LAB — PHOSPHORUS: Phosphorus: 3.7 mg/dL (ref 2.3–4.6)

## 2024-06-09 NOTE — Telephone Encounter (Unsigned)
 Copied from CRM [id number]. Topic: Clinical - Medication Question >> Jun 09, 2024  1:26 PM Burnard DEL wrote: Reason for CRM: Patient called in to check on the status of ozempic  2 MG being sent to pharmacy for him he stated that it has already been approved by his insurance. Provider was suppose to fill at his OV on 06/08/2024.   [id number] - [place], [place] - [address]  Phone: [phone] Fax: [phone]

## 2024-06-10 NOTE — Telephone Encounter (Signed)
 Left message to return call to office. Ok to [employer] below.

## 2024-06-12 ENCOUNTER — Ambulatory Visit: Payer: Self-pay | Admitting: Family Medicine

## 2024-06-12 MED ORDER — VITAMIN B-12 1000 MCG PO TABS: 1000.0000 ug | ORAL_TABLET | Freq: Every day | ORAL | Status: AC

## 2024-06-29 LAB — COLOGUARD: COLOGUARD: NEGATIVE

## 2024-08-14 LAB — LAB REPORT - SCANNED
Albumin, Urine POC: 101.6
Creatinine, POC: 102.2 mg/dL
EGFR: 31
Microalb Creat Ratio: 99

## 2024-12-09 ENCOUNTER — Ambulatory Visit: Admitting: Family Medicine

## 2024-12-09 ENCOUNTER — Encounter: Payer: Self-pay | Admitting: Family Medicine

## 2024-12-09 VITALS — BP 120/86 | HR 101 | Temp 98.6°F | Ht 74.5 in | Wt 216.4 lb

## 2024-12-09 DIAGNOSIS — E1122 Type 2 diabetes mellitus with diabetic chronic kidney disease: Secondary | ICD-10-CM

## 2024-12-09 DIAGNOSIS — N184 Chronic kidney disease, stage 4 (severe): Secondary | ICD-10-CM

## 2024-12-09 DIAGNOSIS — E1121 Type 2 diabetes mellitus with diabetic nephropathy: Secondary | ICD-10-CM | POA: Diagnosis not present

## 2024-12-09 DIAGNOSIS — I10 Essential (primary) hypertension: Secondary | ICD-10-CM

## 2024-12-09 DIAGNOSIS — Z7985 Long-term (current) use of injectable non-insulin antidiabetic drugs: Secondary | ICD-10-CM | POA: Diagnosis not present

## 2024-12-09 LAB — POCT GLYCOSYLATED HEMOGLOBIN (HGB A1C): Hemoglobin A1C: 9.8 % — AB (ref 4.0–5.6)

## 2024-12-09 MED ORDER — SEMAGLUTIDE (2 MG/DOSE) 8 MG/3ML ~~LOC~~ SOPN: 2.0000 mg | PEN_INJECTOR | SUBCUTANEOUS | 1 refills | Status: AC

## 2024-12-09 NOTE — Patient Instructions (Addendum)
 We will request latest diabetic eye exam from [facility] [place] Nanetta Sharps [phone]).  Continue current medicines.  I will refer you to diabetes doctor in [facility] or [place].  Good to see you today.  Return in 6 months for physical.

## 2024-12-09 NOTE — Progress Notes (Signed)
 " Ph: [phone] Fax: [phone]   Patient ID: Rodney Ortiz, male    DOB: [birth year], 49 y.o.   MRN: [id number]  This visit was conducted in person.  BP 120/86 (BP Location: Right Arm, Cuff Size: Large)   Pulse (!) 101   Temp 98.6 F (37 C) (Oral)   Ht 6' 2.5 (1.892 m)   Wt 216 lb 6.4 oz (98.2 kg)   SpO2 97%   BMI 27.41 kg/m   BP Readings from Last 3 Encounters:  12/09/24 120/86  06/08/24 (!) 138/102  12/06/23 138/88   CC: 6 mo f/u visit  Subjective:   HPI: Rodney Ortiz is a 49 y.o. male presenting on 12/09/2024 for Medical Management of Chronic Issues (DM FU/Would like to discuss tdap///Wants ozempic  sent to [facility] and rest of meds mail order)   Lives in [place]. Works in [employer].   10 lb weight loss over the past year in setting of ozempic  use.  Cologuard UTD 2025.  Lab Results  Component Value Date   PSA 1.22 06/08/2024   PSA 1.24 05/25/2022   PSA 1.22 03/20/2021    CKD stage 4 associated with HTN and diabetes - established with Dr Gearline Leash kidney nephrologist, last seen 08/2024, seeing Q50mo. Goal BP <130/80. Latest Umicroalb/cr ratio 99, Cr 2.49 and eGFR 31.   DM - does not regularly check sugars. Compliant with antihyperglycemic regimen which includes: farxiga  10mg  daily, ozempic  2mg  weekly (Saturdays). Admits to dietary liberties especially during holiday season. Denies low sugars or hypoglycemic symptoms. Denies paresthesias, blurry vision. Last diabetic eye exam - in 06/2024 Nanetta Sharps with . Glucometer brand: FL3+ - insurance won't cover. Last foot exam: 12/2023 DUE. DSME: 07/2017.  Lab Results  Component Value Date   HGBA1C 9.8 (A) 12/09/2024   Diabetic Foot Exam - Simple   Simple Foot Form Diabetic Foot exam was performed with the following findings: Yes 12/09/2024  8:54 AM  Visual Inspection No deformities, no ulcerations, no other skin breakdown bilaterally: Yes Sensation Testing Intact to touch and monofilament testing  bilaterally: Yes Pulse Check Posterior Tibialis and Dorsalis pulse intact bilaterally: Yes Comments No claudication    Lab Results  Component Value Date   MICROALBUR 5.3 (H) 06/08/2024   HTN - Compliant with current antihypertensive regimen of carvedilol  25mg  bid, valsartan  320mg  daily, spironolactone  12.5mg  daily. Does not check blood pressures at home. No low blood pressure readings or symptoms of dizziness/syncope. Denies HA, vision changes, CP/tightness, SOB, leg swelling.  Echocardiogram 07/2023: LVEF 55-60%, normal wall motion, mod LVH, G1DD, mild AR.      Relevant past medical, surgical, family and social history reviewed and updated as indicated. Interim medical history since our last visit reviewed. Allergies and medications reviewed and updated. Outpatient Medications Prior to Visit  Medication Sig Dispense Refill   Blood Glucose Monitoring Suppl (ONE TOUCH ULTRA MINI) w/Device KIT 1 kit by Does not apply route as directed. 1 each 0   carvedilol  (COREG ) 25 MG tablet Take 1 tablet (25 mg total) by mouth 2 (two) times daily with a meal. 180 tablet 3   Cholecalciferol (VITAMIN D ) 50 MCG (2000 UT) CAPS Take 1 capsule (2,000 Units total) by mouth daily. 30 capsule    cyanocobalamin  (VITAMIN B12) 1000 MCG tablet Take 1 tablet (1,000 mcg total) by mouth daily.     dapagliflozin  propanediol (FARXIGA ) 10 MG TABS tablet Take 1 tablet (10 mg total) by mouth daily. 30 tablet 11  fluticasone  (FLONASE ) 50 MCG/ACT nasal spray Place 1 spray into both nostrils daily. (Patient taking differently: Place 1 spray into both nostrils as needed.) 16 g 11   glucose blood (ONE TOUCH ULTRA TEST) test strip Use as instructed to check sugars daily and as needed 100 each 3   ONETOUCH DELICA LANCETS 33G MISC Check blood sugar daily and as directed or needed. 100 each 2   simvastatin  (ZOCOR ) 20 MG tablet Take 1 tablet (20 mg total) by mouth at bedtime. 90 tablet 3   spironolactone  (ALDACTONE ) 25 MG tablet Take  0.5 tablets (12.5 mg total) by mouth daily. 45 tablet 3   valsartan  (DIOVAN ) 320 MG tablet Take 1 tablet (320 mg total) by mouth daily. 90 tablet 3   Semaglutide , 2 MG/DOSE, 8 MG/3ML SOPN Inject 2 mg as directed once a week. 9 mL 3   Continuous Glucose Sensor (FREESTYLE LIBRE 3 PLUS SENSOR) MISC Change sensor every 15 days. (Patient not taking: Reported on 12/09/2024) 2 each 11   No facility-administered medications prior to visit.     Per HPI unless specifically indicated in ROS section below Review of Systems  Objective:  BP 120/86 (BP Location: Right Arm, Cuff Size: Large)   Pulse (!) 101   Temp 98.6 F (37 C) (Oral)   Ht 6' 2.5 (1.892 m)   Wt 216 lb 6.4 oz (98.2 kg)   SpO2 97%   BMI 27.41 kg/m   Wt Readings from Last 3 Encounters:  12/09/24 216 lb 6.4 oz (98.2 kg)  06/08/24 220 lb (99.8 kg)  12/06/23 225 lb (102.1 kg)      Physical Exam Vitals and nursing note reviewed.  Constitutional:      Appearance: Normal appearance. He is not ill-appearing.  HENT:     Head: Normocephalic and atraumatic.     Mouth/Throat:     Mouth: Mucous membranes are moist.     Pharynx: Oropharynx is clear. No oropharyngeal exudate or posterior oropharyngeal erythema.  Eyes:     Extraocular Movements: Extraocular movements intact.     Conjunctiva/sclera: Conjunctivae normal.     Pupils: Pupils are equal, round, and reactive to light.  Cardiovascular:     Rate and Rhythm: Normal rate and regular rhythm.     Pulses: Normal pulses.     Heart sounds: Normal heart sounds. No murmur heard. Pulmonary:     Effort: Pulmonary effort is normal. No respiratory distress.     Breath sounds: Normal breath sounds. No wheezing, rhonchi or rales.  Musculoskeletal:     Right lower leg: No edema.     Left lower leg: No edema.     Comments: See HPI for foot exam if done  Skin:    General: Skin is warm and dry.     Findings: No rash.  Neurological:     Mental Status: He is alert.  Psychiatric:         Mood and Affect: Mood normal.        Behavior: Behavior normal.       Results for orders placed or performed in visit on 12/09/24  POCT glycosylated hemoglobin (Hb A1C)   Collection Time: 12/09/24  8:39 AM  Result Value Ref Range   Hemoglobin A1C 9.8 (A) 4.0 - 5.6 %   HbA1c POC (<> result, manual entry)     HbA1c, POC (prediabetic range)     HbA1c, POC (controlled diabetic range)      Assessment & Plan:   Problem List Items Addressed  This Visit     Essential hypertension   Chronic, improving readings with noted weight loss however DBP remains above goal No med changes today.       CKD stage 4 due to type 2 diabetes mellitus (HCC)   Chronic, stable period, appreciate nephrology care (Dr Gearline in [place]).       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  Recommend daily insulin  , he is hesitant.  Agrees to endocrinology referral - [place] preference as that is where he works.  Insurance did not cover CGM.  He is not checking sugars at home.  Requests ozempic  to local pharmacy.       Relevant Medications   Semaglutide , 2 MG/DOSE, 8 MG/3ML SOPN   Other Relevant Orders   POCT glycosylated hemoglobin (Hb A1C) (Completed)   Ambulatory referral to Endocrinology     Meds ordered this encounter  Medications   Semaglutide , 2 MG/DOSE, 8 MG/3ML SOPN    Sig: Inject 2 mg as directed once a week.    Dispense:  9 mL    Refill:  1    Orders Placed This Encounter  Procedures   Ambulatory referral to Endocrinology    Referral Priority:   Routine    Referral Type:   Consultation    Referral Reason:   Specialty Services Required    Number of Visits Requested:   1   POCT glycosylated hemoglobin (Hb A1C)     Patient Instructions  We will request latest diabetic eye exam from [facility] [place] [facility] Nanetta Sharps (06/2024).  Continue current medicines.  I will refer you to diabetes doctor in [facility] or [place].  Good to see you today.  Return in 6 months for physical.   Follow up plan: Return in about 6 months (around 06/08/2025), or if symptoms worsen or fail to improve, for annual exam, prior fasting for blood work.  Anton Blas, MD   "

## 2024-12-09 NOTE — Assessment & Plan Note (Addendum)
 Chronic, persistent poor control despite regular farxiga  10mg  daily and ozempic  2mg  weekly.  Recommend daily insulin  , he is hesitant.  Agrees to endocrinology referral - [place] preference as that is where he works.  Insurance did not cover CGM.  He is not checking sugars at home.  Requests ozempic  to local pharmacy.

## 2024-12-09 NOTE — Assessment & Plan Note (Signed)
 Chronic, improving readings with noted weight loss however DBP remains above goal No med changes today.

## 2024-12-09 NOTE — Assessment & Plan Note (Signed)
 Chronic, stable period, appreciate nephrology care (Dr Gearline in [place]).

## 2025-06-09 ENCOUNTER — Encounter: Admitting: Family Medicine
